# Patient Record
Sex: Male | Born: 1990 | Race: Black or African American | Hispanic: No | Marital: Single | Smoking: Never smoker
Health system: Southern US, Community
[De-identification: ages and names within clinical notes are randomized; demographics above are authoritative.]

## PROBLEM LIST (undated history)

## (undated) DIAGNOSIS — G253 Myoclonus: Secondary | ICD-10-CM

## (undated) DIAGNOSIS — K219 Gastro-esophageal reflux disease without esophagitis: Secondary | ICD-10-CM

## (undated) DIAGNOSIS — F419 Anxiety disorder, unspecified: Secondary | ICD-10-CM

## (undated) DIAGNOSIS — J45909 Unspecified asthma, uncomplicated: Secondary | ICD-10-CM

## (undated) DIAGNOSIS — I10 Essential (primary) hypertension: Secondary | ICD-10-CM

## (undated) DIAGNOSIS — F32A Depression, unspecified: Secondary | ICD-10-CM

## (undated) DIAGNOSIS — F329 Major depressive disorder, single episode, unspecified: Secondary | ICD-10-CM

## (undated) DIAGNOSIS — T7840XA Allergy, unspecified, initial encounter: Secondary | ICD-10-CM

## (undated) HISTORY — DX: Allergy, unspecified, initial encounter: T78.40XA

## (undated) HISTORY — DX: Myoclonus: G25.3

## (undated) HISTORY — DX: Essential (primary) hypertension: I10

## (undated) HISTORY — DX: Gastro-esophageal reflux disease without esophagitis: K21.9

## (undated) HISTORY — PX: OTHER SURGICAL HISTORY: SHX169

---

## 2011-03-01 ENCOUNTER — Emergency Department (HOSPITAL_COMMUNITY)
Admission: EM | Admit: 2011-03-01 | Discharge: 2011-03-01 | Disposition: A | Discharge status: Home / Self Care | Payer: Self-pay | Attending: Emergency Medicine | Admitting: Emergency Medicine

## 2011-03-01 DIAGNOSIS — L2989 Other pruritus: Secondary | ICD-10-CM | POA: Insufficient documentation

## 2011-03-01 DIAGNOSIS — B354 Tinea corporis: Secondary | ICD-10-CM | POA: Insufficient documentation

## 2011-03-01 DIAGNOSIS — L298 Other pruritus: Secondary | ICD-10-CM | POA: Insufficient documentation

## 2011-03-01 DIAGNOSIS — R21 Rash and other nonspecific skin eruption: Secondary | ICD-10-CM | POA: Insufficient documentation

## 2011-03-01 DIAGNOSIS — J45909 Unspecified asthma, uncomplicated: Secondary | ICD-10-CM | POA: Insufficient documentation

## 2011-07-12 ENCOUNTER — Encounter: Payer: Self-pay | Admitting: Nurse Practitioner

## 2011-07-12 ENCOUNTER — Emergency Department (HOSPITAL_COMMUNITY)
Admission: EM | Admit: 2011-07-12 | Discharge: 2011-07-12 | Disposition: A | Discharge status: Home / Self Care | Payer: Self-pay | Attending: Emergency Medicine | Admitting: Emergency Medicine

## 2011-07-12 DIAGNOSIS — L298 Other pruritus: Secondary | ICD-10-CM | POA: Insufficient documentation

## 2011-07-12 DIAGNOSIS — L2989 Other pruritus: Secondary | ICD-10-CM | POA: Insufficient documentation

## 2011-07-12 DIAGNOSIS — R21 Rash and other nonspecific skin eruption: Secondary | ICD-10-CM | POA: Insufficient documentation

## 2011-07-12 DIAGNOSIS — B354 Tinea corporis: Secondary | ICD-10-CM | POA: Insufficient documentation

## 2011-07-12 MED ORDER — KETOCONAZOLE 2 % EX CREA: TOPICAL_CREAM | Freq: Every day | CUTANEOUS | Status: AC

## 2011-07-12 NOTE — ED Notes (Signed)
Pt with itchy rash to neck/abd/groin/egs x 3 weeks. No one in household with same symptoms. No new products or meds.

## 2011-07-12 NOTE — ED Provider Notes (Signed)
History     CSN: [id number] Arrival date & time: 07/12/2011  1:39 PM   None     Chief Complaint  Patient presents with  . Rash    (Consider location/radiation/quality/duration/timing/severity/associated sxs/prior treatment) Patient is a 20 y.o. male presenting with rash. The history is provided by the patient. No language interpreter was used.  Rash  This is a recurrent problem. The current episode started more than 1 week ago. The problem has been gradually improving. The problem is associated with an unknown factor. There has been no fever. The rash is present on the torso, back, neck and left upper leg. The pain is at a severity of 0/10. The patient is experiencing no pain. The pain has been constant since onset. Associated symptoms include itching. Pertinent negatives include no blisters, no pain and no weeping. He has tried anti-itch cream for the symptoms. The treatment provided mild relief.   Patient is here with rash to R neck which is getting better by using anti-fungal cream.  Now the rash is spreading to his trunk and LE.  Mild itching, no fever.   History reviewed. No pertinent past medical history.  History reviewed. No pertinent past surgical history.  History reviewed. No pertinent family history.  History  Substance Use Topics  . Smoking status: Passive Smoker  . Smokeless tobacco: Not on file  . Alcohol Use: Yes     rare      Review of Systems  Skin: Positive for itching and rash.  All other systems reviewed and are negative.    Allergies  Review of patient's allergies indicates no known allergies.  Home Medications   Current Outpatient Rx  Name Route Sig Dispense Refill  . KETOCONAZOLE 2 % EX CREA Topical Apply topically daily. 15 g 3    BP 149/77  Pulse 87  Temp(Src) 98.2 F (36.8 C) (Oral)  Resp 16  Ht 5\' 10"  (1.778 m)  Wt 150 lb (68.04 kg)  BMI 21.52 kg/m2  SpO2 100%  Physical Exam  Nursing note and vitals reviewed. Constitutional:  He is oriented to person, place, and time. He appears well-developed and well-nourished. No distress.  HENT:  Head: Normocephalic.  Neck: Normal range of motion.  Cardiovascular: Normal rate.   Pulmonary/Chest: Effort normal.  Musculoskeletal: Normal range of motion.  Neurological: He is alert and oriented to person, place, and time.  Skin: Skin is warm and dry. Rash noted.  Psychiatric: He has a normal mood and affect.    ED Course  Procedures (including critical care time)  Labs Reviewed - No data to display No results found.   1. Tinea corporis       MDM  Treated for ringworm  Medical screening examination/treatment/procedure(s) were performed by non-physician practitioner and as supervising physician I was immediately available for consultation/collaboration. Osvaldo Human, M.D.       Jethro Bastos, NP 07/12/11 1437  Carleene Cooper III, MD 07/12/11 [phone]

## 2013-09-01 ENCOUNTER — Emergency Department (HOSPITAL_COMMUNITY)
Admission: EM | Admit: 2013-09-01 | Discharge: 2013-09-02 | Disposition: A | Discharge status: Home / Self Care | Payer: Self-pay | Attending: Emergency Medicine | Admitting: Emergency Medicine

## 2013-09-01 ENCOUNTER — Encounter (HOSPITAL_COMMUNITY): Payer: Self-pay | Admitting: Emergency Medicine

## 2013-09-01 DIAGNOSIS — L509 Urticaria, unspecified: Secondary | ICD-10-CM | POA: Insufficient documentation

## 2013-09-01 DIAGNOSIS — T398X2A Poisoning by other nonopioid analgesics and antipyretics, not elsewhere classified, intentional self-harm, initial encounter: Secondary | ICD-10-CM

## 2013-09-01 DIAGNOSIS — F329 Major depressive disorder, single episode, unspecified: Secondary | ICD-10-CM | POA: Insufficient documentation

## 2013-09-01 DIAGNOSIS — T450X4A Poisoning by antiallergic and antiemetic drugs, undetermined, initial encounter: Secondary | ICD-10-CM | POA: Insufficient documentation

## 2013-09-01 DIAGNOSIS — F3289 Other specified depressive episodes: Secondary | ICD-10-CM | POA: Insufficient documentation

## 2013-09-01 DIAGNOSIS — T394X2A Poisoning by antirheumatics, not elsewhere classified, intentional self-harm, initial encounter: Secondary | ICD-10-CM | POA: Insufficient documentation

## 2013-09-01 DIAGNOSIS — F32A Depression, unspecified: Secondary | ICD-10-CM

## 2013-09-01 DIAGNOSIS — T50992A Poisoning by other drugs, medicaments and biological substances, intentional self-harm, initial encounter: Secondary | ICD-10-CM | POA: Insufficient documentation

## 2013-09-01 DIAGNOSIS — J45909 Unspecified asthma, uncomplicated: Secondary | ICD-10-CM | POA: Insufficient documentation

## 2013-09-01 DIAGNOSIS — R000 Tachycardia, unspecified: Secondary | ICD-10-CM | POA: Insufficient documentation

## 2013-09-01 DIAGNOSIS — T391X1A Poisoning by 4-Aminophenol derivatives, accidental (unintentional), initial encounter: Secondary | ICD-10-CM | POA: Insufficient documentation

## 2013-09-01 DIAGNOSIS — T50902A Poisoning by unspecified drugs, medicaments and biological substances, intentional self-harm, initial encounter: Secondary | ICD-10-CM

## 2013-09-01 HISTORY — DX: Depression, unspecified: F32.A

## 2013-09-01 HISTORY — DX: Major depressive disorder, single episode, unspecified: F32.9

## 2013-09-01 HISTORY — DX: Anxiety disorder, unspecified: F41.9

## 2013-09-01 HISTORY — DX: Unspecified asthma, uncomplicated: J45.909

## 2013-09-01 LAB — CBC
HCT: 44.7 % (ref 39.0–52.0)
Hemoglobin: 14.9 g/dL (ref 13.0–17.0)
MCH: 29.8 pg (ref 26.0–34.0)
MCHC: 33.3 g/dL (ref 30.0–36.0)
MCV: 89.4 fL (ref 78.0–100.0)
Platelets: 239 10*3/uL (ref 150–400)
RBC: 5 MIL/uL (ref 4.22–5.81)
RDW: 12.1 % (ref 11.5–15.5)
WBC: 6.2 10*3/uL (ref 4.0–10.5)

## 2013-09-01 LAB — COMPREHENSIVE METABOLIC PANEL
ALT: 12 U/L (ref 0–53)
AST: 24 U/L (ref 0–37)
Albumin: 4.6 g/dL (ref 3.5–5.2)
Alkaline Phosphatase: 43 U/L (ref 39–117)
BUN: 13 mg/dL (ref 6–23)
CO2: 24 mEq/L (ref 19–32)
Calcium: 9.6 mg/dL (ref 8.4–10.5)
Chloride: 100 mEq/L (ref 96–112)
Creatinine, Ser: 1.21 mg/dL (ref 0.50–1.35)
GFR calc Af Amer: >90 mL/min (ref >90)
GFR calc non Af Amer: 83 mL/min — ABNORMAL LOW (ref >90)
Glucose, Bld: 152 mg/dL — ABNORMAL HIGH (ref 70–99)
Potassium: 3.8 mEq/L (ref 3.7–5.3)
Sodium: 138 mEq/L (ref 137–147)
Total Bilirubin: 0.4 mg/dL (ref 0.3–1.2)
Total Protein: 7.8 g/dL (ref 6.0–8.3)

## 2013-09-01 LAB — ACETAMINOPHEN LEVEL: Acetaminophen (Tylenol), Serum: <15 ug/mL (ref 10–30)

## 2013-09-01 LAB — RAPID URINE DRUG SCREEN, HOSP PERFORMED
Amphetamines: NOT DETECTED
Barbiturates: NOT DETECTED
Benzodiazepines: NOT DETECTED
Cocaine: NOT DETECTED
Opiates: NOT DETECTED
Tetrahydrocannabinol: POSITIVE — AB

## 2013-09-01 LAB — ETHANOL: Alcohol, Ethyl (B): <11 mg/dL (ref 0–11)

## 2013-09-01 LAB — SALICYLATE LEVEL: Salicylate Lvl: <2 mg/dL — ABNORMAL LOW (ref 2.8–20.0)

## 2013-09-01 NOTE — ED Notes (Addendum)
Poison control called. Recommend to get EKG check liver enzymes. Cheryl from poison control

## 2013-09-01 NOTE — ED Notes (Signed)
Pt reports SI for a few yaers, but last night took coricidin to try and kill himself. Denies physical pain. Reports problems with family and friends. Denies HI, AH/VH

## 2013-09-01 NOTE — ED Provider Notes (Signed)
CSN: [id number]     Arrival date & time 09/01/13  1821 History   First MD Initiated Contact with Patient 09/01/13 2110     Chief Complaint  Patient presents with  . Medical Clearance   (Consider location/radiation/quality/duration/timing/severity/associated sxs/prior Treatment) HPI  23 year old male with depression. Long-standing issue since early teenage years. No recent evaluations. Patient reports hives and lows. He feels like he is withdrawn. Dissected does not have many friends and is not L. doing the things like partying and going out he feels someone his age should be. He lives at home with his mother and he feels like she has to controlling. Currently not employed. The started back at school a few weeks ago. Occasional drug use. Last night it is felt overwhelmed and took approximately 20 tablets of Coricidin. He was trying to hurt himself. Denies any other ingestion. States he feels withdrawn. Uncomfortable in social situation. He feels like he has to drink to interact with people and typically does to excess and blacks out.    Past Medical History  Diagnosis Date  . Asthma    History reviewed. No pertinent past surgical history. History reviewed. No pertinent family history. History  Substance Use Topics  . Smoking status: Passive Smoke Exposure - Never Smoker  . Smokeless tobacco: Not on file  . Alcohol Use: Yes     Comment: rare    Review of Systems  All systems reviewed and negative, other than as noted in HPI.   Allergies  Review of patient's allergies indicates no known allergies.  Home Medications  No current outpatient prescriptions on file. BP 160/85  Pulse 111  Temp(Src) 98.3 F (36.8 C) (Oral)  Resp 16  SpO2 98% Physical Exam  Nursing note and vitals reviewed. Constitutional: He is oriented to person, place, and time. He appears well-developed and well-nourished. No distress.  HENT:  Head: Normocephalic and atraumatic.  Eyes: Conjunctivae are normal.  Right eye exhibits no discharge. Left eye exhibits no discharge.  Neck: Neck supple.  Cardiovascular: Regular rhythm and normal heart sounds.  Exam reveals no gallop and no friction rub.   No murmur heard. Mild tachycardia  Pulmonary/Chest: Effort normal and breath sounds normal. No respiratory distress.  Abdominal: Soft. He exhibits no distension. There is no tenderness.  Musculoskeletal: He exhibits no edema and no tenderness.  Neurological: He is alert and oriented to person, place, and time. No cranial nerve deficit. He exhibits normal muscle tone. Coordination normal.  Speech clear. Content appropriate. Occasionally some inappropriate laughter which seems from being uncomfortable. Does not appear to be responding to internal stimuli.   Skin: Skin is warm and dry.  Psychiatric: He has a normal mood and affect. His behavior is normal. Thought content normal.    ED Course  Procedures (including critical care time) Labs Review Labs Reviewed  COMPREHENSIVE METABOLIC PANEL - Abnormal; Notable for the following:    Glucose, Bld 152 (*)    GFR calc non Af Amer 83 (*)    All other components within normal limits  SALICYLATE LEVEL - Abnormal; Notable for the following:    Salicylate Lvl <2.0 (*)    All other components within normal limits  URINE RAPID DRUG SCREEN (HOSP PERFORMED) - Abnormal; Notable for the following:    Tetrahydrocannabinol POSITIVE (*)    All other components within normal limits  ACETAMINOPHEN LEVEL  CBC  ETHANOL   Imaging Review No results found.  EKG Interpretation   None  MDM   1. Intentional drug overdose   2. Depression     23yM with depression. SI with attempt last night. No HI nor psychosis. Possibly some element of social anxiety. Medically cleared at this point from ingestion. Tylenol level normal. Normal LFTs. Sinus tach noted on EKG. Consider 2/2 antihistamine in coricidin, but intervals ok. Psych eval.     Raeford RazorStephen Shriyans Kuenzi, MD 09/02/13  [phone]

## 2013-09-02 ENCOUNTER — Encounter (HOSPITAL_COMMUNITY): Payer: Self-pay | Admitting: *Deleted

## 2013-09-02 ENCOUNTER — Inpatient Hospital Stay (HOSPITAL_COMMUNITY)
Admission: EM | Admit: 2013-09-02 | Discharge: 2013-09-08 | DRG: 885 | Disposition: A | Discharge status: Home / Self Care | Payer: No Typology Code available for payment source | Source: Intra-hospital | Attending: Psychiatry | Admitting: Psychiatry

## 2013-09-02 DIAGNOSIS — F332 Major depressive disorder, recurrent severe without psychotic features: Principal | ICD-10-CM | POA: Diagnosis present

## 2013-09-02 DIAGNOSIS — Z598 Other problems related to housing and economic circumstances: Secondary | ICD-10-CM

## 2013-09-02 DIAGNOSIS — F1010 Alcohol abuse, uncomplicated: Secondary | ICD-10-CM | POA: Diagnosis present

## 2013-09-02 DIAGNOSIS — Z5987 Material hardship due to limited financial resources, not elsewhere classified: Secondary | ICD-10-CM

## 2013-09-02 DIAGNOSIS — M4120 Other idiopathic scoliosis, site unspecified: Secondary | ICD-10-CM | POA: Diagnosis present

## 2013-09-02 DIAGNOSIS — F1994 Other psychoactive substance use, unspecified with psychoactive substance-induced mood disorder: Secondary | ICD-10-CM | POA: Diagnosis present

## 2013-09-02 DIAGNOSIS — R45851 Suicidal ideations: Secondary | ICD-10-CM

## 2013-09-02 DIAGNOSIS — F411 Generalized anxiety disorder: Secondary | ICD-10-CM | POA: Diagnosis present

## 2013-09-02 DIAGNOSIS — T6592XA Toxic effect of unspecified substance, intentional self-harm, initial encounter: Secondary | ICD-10-CM | POA: Diagnosis present

## 2013-09-02 DIAGNOSIS — F329 Major depressive disorder, single episode, unspecified: Secondary | ICD-10-CM | POA: Diagnosis present

## 2013-09-02 DIAGNOSIS — F1210 Cannabis abuse, uncomplicated: Secondary | ICD-10-CM | POA: Diagnosis present

## 2013-09-02 DIAGNOSIS — G4700 Insomnia, unspecified: Secondary | ICD-10-CM | POA: Diagnosis present

## 2013-09-02 DIAGNOSIS — J45909 Unspecified asthma, uncomplicated: Secondary | ICD-10-CM | POA: Diagnosis present

## 2013-09-02 DIAGNOSIS — T6591XA Toxic effect of unspecified substance, accidental (unintentional), initial encounter: Secondary | ICD-10-CM

## 2013-09-02 MED ORDER — MAGNESIUM HYDROXIDE 400 MG/5ML PO SUSP
30.0000 mL | Freq: Every day | ORAL | Status: DC | PRN
Start: 2013-09-02 — End: 2013-09-08

## 2013-09-02 MED ORDER — TRAZODONE HCL 50 MG PO TABS
50.0000 mg | ORAL_TABLET | Freq: Every evening | ORAL | Status: DC | PRN
  Administered 2013-09-02 – 2013-09-03 (×2): 50 mg via ORAL
  Filled 2013-09-02 (×8): qty 1

## 2013-09-02 MED ORDER — BUPROPION HCL ER (SR) 100 MG PO TB12
100.0000 mg | ORAL_TABLET | Freq: Every day | ORAL | Status: DC
  Administered 2013-09-02 – 2013-09-07 (×6): 100 mg via ORAL
  Filled 2013-09-02 (×10): qty 1

## 2013-09-02 MED ORDER — ALUM & MAG HYDROXIDE-SIMETH 200-200-20 MG/5ML PO SUSP: 30.0000 mL | ORAL | Status: DC | PRN

## 2013-09-02 MED ORDER — ACETAMINOPHEN 325 MG PO TABS: 650.0000 mg | ORAL_TABLET | Freq: Four times a day (QID) | ORAL | Status: DC | PRN

## 2013-09-02 NOTE — Tx Team (Signed)
Interdisciplinary Treatment Plan Update   Date Reviewed:  09/02/2013  Time Reviewed:  8:25 AM  Progress in Treatment:   Attending groups: Yes Participating in groups: Yes Taking medication as prescribed: Yes  Tolerating medication: Yes Family/Significant other contact made: No, but will ask patient for consent for collateral contact Patient understands diagnosis: Yes  Discussing patient identified problems/goals with staff: Yes Medical problems stabilized or resolved: Yes Denies suicidal/homicidal ideation: Yes Patient has not harmed self or others: Yes  For review of initial/current patient goals, please see plan of care.  Estimated Length of Stay:  4-5 days  Reasons for Continued Hospitalization:  Anxiety Depression Medication stabilization Suicidal ideation  New Problems/Goals identified:    Discharge Plan or Barriers:   Home with outpatient follow up to be determined  Additional Comments:  Eugene Sanders is a 23 y.o. male [facility] SI/Depression/Social Anxiety. Pt denies HI/AVH. Pt reports the following: Pt felt overwhelmed with life, states that a male friend that he liked did not want to take the friendship any further and he was hurt by her rejection and ingested 16-20 Coricidin tabs. He admits he wanted to harm himself. This is pt.'s 1st SI attempt and says he has SI thoughts all the time. Pt says he long hx of depression since his teens and has no hx of outpatient services. Pt tells this Clinical research associatewriter that he has social anxiety and trouble talking to people and doesn't like being alone--"I have to learn how to be by myself". Pt says he lives with his mother and brother and says they are not close and are not supportive. Pt tells this Clinical research associatewriter that he uses alcohol and marijuana--drinks 1-2 40's weekly and 1 blunt weekly.    Attendees:  Patient:  09/02/2013 8:25 AM   Signature: Mervyn GayJ. Jonnalagadda, MD 09/02/2013 8:25 AM  Signature:  09/02/2013 8:25 AM  Signature:  Claudette Headonrad  Withrow, NP 09/02/2013 8:25 AM  Signature:Beverly Terrilee CroakKnight, RN 09/02/2013 8:25 AM  Signature:  09/02/2013 8:25 AM  Signature:  Juline PatchQuylle Quashaun Lazalde, LCSW 09/02/2013 8:25 AM  Signature:  Eugene Ivanhelsea Horton, LCSW 09/02/2013 8:25 AM  Signature:  Leisa LenzValerie Enoch, Care Coordinator 09/02/2013 8:25 AM  Signature:  Aloha GellKrista Dopson, RN 09/02/2013 8:25 AM  Signature:  09/02/2013  8:25 AM  Signature:   Onnie BoerJennifer Clark, RN [facility] 09/02/2013  8:25 AM  Signature:  Harold Barbanonecia Byrd, RN 09/02/2013  8:25 AM    Scribe for Treatment Team:   Juline PatchQuylle Danelly Hassinger,  09/02/2013 8:25 AM

## 2013-09-02 NOTE — BH Assessment (Signed)
Assessment Note  Eugene Sanders is a 23 y.o. male who presents to [facility] with SI/Depression/Social Anxiety.  Pt denies HI/AVH. Pt reports the following:  Pt felt overwhelmed with life, states that a male friend that he liked did not want to take the friendship any further and he was hurt by her rejection and ingested 16-20 Coricidin tabs. He admits he wanted to harm himself. This is pt.'s 1st SI attempt and says he has SI thoughts all the time.  Pt says he long hx of depression since his teens and has no hx of outpatient services.  Pt tells this Clinical research associate that he has social anxiety and trouble talking to people and doesn't like being alone--"I have to learn how to be by myself".   Pt says he lives with his mother and brother and says they are not close and are not supportive.  Pt tells this Clinical research associate that he uses alcohol and marijuana--drinks 1-2 40's weekly and 1 blunt weekly.         Axis I: Alcohol Abuse, Depressive Disorder NOS and Cannabis Abuse  Axis II: Deferred Axis III:  Past Medical History  Diagnosis Date  . Asthma   . Depression   . Anxiety    Axis IV: other psychosocial or environmental problems, problems related to social environment and problems with primary support group Axis V: 31-40 impairment in reality testing  Past Medical History:  Past Medical History  Diagnosis Date  . Asthma   . Depression   . Anxiety     History reviewed. No pertinent past surgical history.  Family History: History reviewed. No pertinent family history.  Social History:  reports that he has been passively smoking.  He does not have any smokeless tobacco history on file. He reports that he drinks alcohol. He reports that he uses illicit drugs (Marijuana).  Additional Social History:  Alcohol / Drug Use Pain Medications: None  Prescriptions: None  Over the Counter: None  History of alcohol / drug use?: Yes Substance #1 Name of Substance 1: Alcohol  1 - Age of First Use: Teens  1 - Amount  (size/oz): 1-2 40's  1 - Frequency: Weekly  1 - Duration: On-going  1 - Last Use / Amount: Unk  Substance #2 Name of Substance 2: THC  2 - Age of First Use: Teens  2 - Amount (size/oz): 1 Blunt  2 - Frequency: Occasional  2 - Duration: On-going  2 - Last Use / Amount: Unk   CIWA: CIWA-Ar BP: 131/87 mmHg Pulse Rate: 77 COWS:    Allergies: No Known Allergies  Home Medications:  (Not in a hospital admission)  OB/GYN Status:  No LMP for male patient.  General Assessment Data Location of Assessment: [facility] ED Is this a Tele or Face-to-Face Assessment?: Tele Assessment Is this an Initial Assessment or a Re-assessment for this encounter?: Initial Assessment Living Arrangements: Parent;Other relatives (Lives with mother and brother ) Can pt return to current living arrangement?: Yes Admission Status: Voluntary Is patient capable of signing voluntary admission?: Yes Transfer from: Acute Hospital Referral Source: MD  Medical Screening Exam [facility] ONLY) Medical Exam completed: No Reason for MSE not completed: Other: (None )  [facility] Crisis Care Plan Living Arrangements: Parent;Other relatives (Lives with mother and brother ) Name of Psychiatrist: None  Name of Therapist: None   Education Status Is patient currently in school?: No Current Grade: None  Highest grade of school patient has completed: None  Name of school: None  Contact  person: None   Risk to self Suicidal Ideation: Yes-Currently Present Suicidal Intent: Yes-Currently Present Is patient at risk for suicide?: Yes Suicidal Plan?: Yes-Currently Present Specify Current Suicidal Plan: Overdosed on 16-20 Coricidin  Access to Means: Yes Specify Access to Suicidal Means: Pills What has been your use of drugs/alcohol within the last 12 months?: Abusing: alcohol, thc  Previous Attempts/Gestures: No How many times?: 0 Other Self Harm Risks: None  Triggers for Past Attempts: None known Intentional Self Injurious  Behavior: None Family Suicide History: No Recent stressful life event(s): Other (Comment) (SI/depression ) Persecutory voices/beliefs?: No Depression: Yes Depression Symptoms: Loss of interest in usual pleasures;Feeling worthless/self pity;Isolating;Insomnia Substance abuse history and/or treatment for substance abuse?: Yes Suicide prevention information given to non-admitted patients: Not applicable  Risk to Others Homicidal Ideation: No Thoughts of Harm to Others: No Current Homicidal Intent: No Current Homicidal Plan: No Access to Homicidal Means: No Identified Victim: None  History of harm to others?: No Assessment of Violence: None Noted Violent Behavior Description: None  Does patient have access to weapons?: No Criminal Charges Pending?: No Does patient have a court date: No  Psychosis Hallucinations: None noted Delusions: None noted  Mental Status Report Appear/Hygiene: Other (Comment) (Appropriate ) Eye Contact: Fair Motor Activity: Unremarkable Speech: Logical/coherent;Soft Level of Consciousness: Alert Mood: Depressed;Sad Affect: Depressed;Sad Anxiety Level: None Thought Processes: Coherent;Relevant Judgement: Impaired Orientation: Person;Place;Time;Situation Obsessive Compulsive Thoughts/Behaviors: None  Cognitive Functioning Concentration: Normal Memory: Recent Intact;Remote Intact IQ: Average Insight: Poor Impulse Control: Poor Appetite: Fair Weight Loss: 0 Weight Gain: 0 Sleep: Decreased Total Hours of Sleep: 2 Vegetative Symptoms: None  ADLScreening [facility] Assessment Services) Patient's cognitive ability adequate to safely complete daily activities?: Yes Patient able to express need for assistance with ADLs?: Yes Independently performs ADLs?: Yes (appropriate for developmental age)  Prior Inpatient Therapy Prior Inpatient Therapy: No Prior Therapy Dates: None  Prior Therapy Facilty/Provider(s): None  Reason for Treatment: None   Prior  Outpatient Therapy Prior Outpatient Therapy: No Prior Therapy Dates: None  Prior Therapy Facilty/Provider(s): None  Reason for Treatment: None   ADL Screening (condition at time of admission) Patient's cognitive ability adequate to safely complete daily activities?: Yes Is the patient deaf or have difficulty hearing?: No Does the patient have difficulty seeing, even when wearing glasses/contacts?: No Does the patient have difficulty concentrating, remembering, or making decisions?: No Patient able to express need for assistance with ADLs?: Yes Does the patient have difficulty dressing or bathing?: No Independently performs ADLs?: Yes (appropriate for developmental age) Does the patient have difficulty walking or climbing stairs?: No Weakness of Legs: None Weakness of Arms/Hands: None  Home Assistive Devices/Equipment Home Assistive Devices/Equipment: None  Therapy Consults (therapy consults require a physician order) PT Evaluation Needed: No OT Evalulation Needed: No SLP Evaluation Needed: No Abuse/Neglect Assessment (Assessment to be complete while patient is alone) Physical Abuse: Denies Verbal Abuse: Denies Sexual Abuse: Denies Exploitation of patient/patient's resources: Denies Self-Neglect: Denies Values / Beliefs Cultural Requests During Hospitalization: None Spiritual Requests During Hospitalization: None Consults Spiritual Care Consult Needed: No Social Work Consult Needed: No Merchant navy officer (For Healthcare) Advance Directive: Patient does not have advance directive;Patient would not like information Pre-existing out of facility DNR order (yellow form or pink MOST form): No Nutrition Screen- [facility] Patient's home diet: Regular  Additional Information 1:1 In Past 12 Months?: No CIRT Risk: No Elopement Risk: No Does patient have medical clearance?: Yes     Disposition:  Disposition Initial Assessment Completed for this Encounter:  Yes Disposition  of Patient: Inpatient treatment program;Referred to (Acceopted by Donell SievertSpencer Simon, [place]; [phone]) Type of inpatient treatment program: Adult Patient referred to: Other (Comment) (Accepted by Donell SievertSpencer Simon, PA; [phone])  On Site Evaluation by:   Reviewed with Physician:    Murrell ReddenSimmons, Fleurette Woolbright C 09/02/2013 4:08 AM

## 2013-09-02 NOTE — [facility] Group Notes (Signed)
[facility] LCSW Group Therapy  Emotional Regulation 1:15 - 2: 30 PM        09/02/2013  3:34 PM   Type of Therapy:  Group Therapy  Participation Level:  Appropriate  Participation Quality:  Appropriate  Affect:  Appropriate  Cognitive:  Attentive Appropriate  Insight:  Developing/Improving Engaged  Engagement in Therapy:  Developing/Improving Engaged  Modes of Intervention:  Discussion Exploration Problem-Solving Supportive  Summary of Progress/Problems:  Group topic was emotional regulations.  Patient participated in the discussion and was able to identify an emotion that needed to regulated.  Patient shared he has a lot of anger over past event and will sometimes act out in aggression.  Patient was able to identify approprite coping skills and the need to walk away from trouble.  Wynn BankerHodnett, Tricha Ruggirello Hairston 09/02/2013 3:34 PM

## 2013-09-02 NOTE — Progress Notes (Signed)
This is a 23 years old African American male admitted this morning for Depressive d/O NOS and Alcohol Abuse. He reported feeling overwhelmed with life and he took a pack of cold medication wanting to end his life. Endorsed having mood swings; "one minute I'm happy the next minute I'm sad, I isolate my self from people and drink". He also stated that he has relationship problem with girl friend and; " I can't continue without her". He said he lives with his mother and brother but they don't really get along. Patient endorsed daily alcohol and THC use; has been drinking daily since he was 23 years old up to 2(40 oz) daily and THC weekly since he was 23 years old. 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 He denied SI/HI and denied hallucinations during the assessment. Skin assessment within normal limit, thought process organized, mood and affect sad and depressed. Patient oriented to the unit and Q 15 minute check initiated.

## 2013-09-02 NOTE — Progress Notes (Signed)
D Pt. Denies SI and HI.  No complaints of pain or discomfort noted.   A Writer offered support and encouragement.  Discussed coping skills with pt.  R Pt remains safe on the unit.  Pt. Makes poor eye contact and forwards little.  Pt. Was in bed when write assessed him.  He received his first dose of wellbutrin this pm.
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
## 2013-09-02 NOTE — [facility] Group Notes (Signed)
[facility]   09/02/2013 9:46 AM    Participation Quality:  Appropraite  Mood/Affect:  Appropriate  Depression Rating:  8  Anxiety Rating:  2  Thoughts of Suicide:  No  Will you contract for safety?   NA  Current AVH:  No  Plan for Discharge/Comments:  Patient attended discharge planning group and actively participated in group.  He reports admitting following a suicide attempt.  He advised of not having outpatient services.  He endorsed daily ETOH use.  CSW provided all participants with daily workbook.   Transportation Means: Patient has transportation.   Supports:  Patient has a support system.   Kiven Vangilder, Joesph JulyQuylle Hairston

## 2013-09-02 NOTE — Tx Team (Signed)
Initial Interdisciplinary Treatment Plan  PATIENT STRENGTHS: (choose at least two) Average or above average intelligence Motivation for treatment/growth Supportive family/friends  PATIENT STRESSORS: Financial difficulties Marital or family conflict Occupational concerns Substance abuse   PROBLEM LIST: Problem List/Patient Goals Date to be addressed Date deferred Reason deferred Estimated date of resolution  Depression 09/02/13                                                      DISCHARGE CRITERIA:  Ability to meet basic life and health needs Improved stabilization in mood, thinking, and/or behavior Motivation to continue treatment in a less acute level of care Verbal commitment to aftercare and medication compliance Withdrawal symptoms are absent or subacute and managed without 24-hour nursing intervention  PRELIMINARY DISCHARGE PLAN: Attend PHP/IOP Outpatient therapy Participate in family therapy Return to previous living arrangement Return to previous work or school arrangements  PATIENT/FAMIILY INVOLVEMENT: This treatment plan has been presented to and reviewed with the patient, Eugene Sanders, and/or family member,.  The patient and family have been given the opportunity to ask questions and make suggestions.  Eugene Sanders, Eugene Sanders [facility] 09/02/2013, 5:05 AM

## 2013-09-02 NOTE — Progress Notes (Signed)
Adult Psychoeducational Group Note  Date:  09/02/2013 Time:  2:23 PM  Group Topic/Focus:  Personal Choices and Values:   The focus of this group is to help patients assess and explore the importance of values in their lives, how their values affect their decisions, how they express their values and what opposes their expression.  Participation Level:  Did Not Attend   Tonita CongMcLaurin, Farzad Tibbetts L 09/02/2013, 2:23 PM

## 2013-09-02 NOTE — Progress Notes (Addendum)
D:  Patient's self inventory sheet, patient has poor sleep, improving appetite, normal energy level, improving attention span.  Rated depression 8, hopeless 5.   SI off/on, contracts for safety.  Denied physical problems.  "Be more social and tell people if I have a problem.  Try to find my purpose in life again."  Plans to live with mother after discharge.  Needs financial assistance to purchase medications. A:  Medications administered per MD orders.  Emotional support and encouragement given patient. R:  Denied HI.  Denied A/V hallucinations.  Denied pain.  Stated he is off/on SI, contracts for safety.  Will continue to monitor patient for safety with 15 minute checks.  Safety maintained.

## 2013-09-02 NOTE — Progress Notes (Signed)
Pt did not attend group due to him sleeping.
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
## 2013-09-02 NOTE — ED Notes (Signed)
No acute distress noted.

## 2013-09-03 DIAGNOSIS — F411 Generalized anxiety disorder: Secondary | ICD-10-CM

## 2013-09-03 DIAGNOSIS — F1010 Alcohol abuse, uncomplicated: Secondary | ICD-10-CM

## 2013-09-03 MED ORDER — HYDROXYZINE HCL 25 MG PO TABS
25.0000 mg | ORAL_TABLET | Freq: Four times a day (QID) | ORAL | Status: DC | PRN
  Administered 2013-09-03: 25 mg via ORAL
  Filled 2013-09-03: qty 1

## 2013-09-03 NOTE — BHH Group Notes (Signed)
B[facility] LCSW Group Therapy  [facility] [place] 1:15 - 2:30 PM  09/03/2013   Type of Therapy:  Group Therapy  Participation Level: Minimal  Participation Quality:  Attentive  Affect:  Appropriate  Cognitive:  Appropriate  Insight:  Developing/Improving   Engagement in Therapy:  Developing/Improving   Modes of Intervention:  Discussion, Education, Exploration, Problem-Solving, Rapport Building, Support   Summary of Progress/Problems:  Patient listened attentively to speaker from [facility] but made no comments on the presentation.   Wynn BankerHodnett, Dailee Manalang Hairston 09/03/2013

## 2013-09-03 NOTE — Progress Notes (Signed)
Recreation Therapy Notes  Date: 01.29.2015 Time: 2:45pm Location: [address] Dayroom   Group Topic: Leisure Education  Goal Area(s) Addresses:  Patient will identify positive leisure activities.  Patient will identify one positive benefit of participation in leisure activities.   Behavioral Response:  Appropriate   Intervention: Game  Activity: Leisure ABC's. Patients were asked to identify a leisure activity to correspond with each letter of the alphabet. Group discussion focused on the use of leisure as a coping mechanism, as well as a way to build support system post d/c.   Education:  Leisure Education, Building control surveyorDischarge Planning, Coping Skills   Education Outcome: Acknowledges understanding  Clinical Observations/Feedback: Patient attended group, but did not contribute to group list or discussion.    Marykay Lexenise L Maribella Kuna, LRT/CTRS  Ester Hilley L 09/03/2013 5:11 PM

## 2013-09-03 NOTE — Progress Notes (Signed)
Adult Psychoeducational Group Note  Date:  09/03/2013 Time:  1:42 PM  Group Topic/Focus:  Stages of Change:   The focus of this group is to explain the stages of change and help patients identify changes they want to make upon discharge.  Participation Level:  Active  Participation Quality:  Appropriate  Affect:  Appropriate  Cognitive:  Appropriate  Insight: Appropriate  Engagement in Group:  Engaged  Modes of Intervention:  Discussion and Education  Additional Comments:  Pt. Participated in group. Pt expressed that he enjoys watching TV and chilling when dealing with life stressors.  Tonita CongMcLaurin, Garnie Borchardt L 09/03/2013, 1:42 PM

## 2013-09-03 NOTE — Progress Notes (Signed)
D: Pt denies SI/HI/AVH. Pt is pleasant and cooperative. Pt says he wants to focus on school ([employer]). Pt wants to be a Emergency planning/management officerpolice officer. "everything going good, I'm good"  A: Pt was offered support and encouragement. Pt was given scheduled medications. Pt was encourage to attend groups. Q 15 minute checks were done for safety.   R:Pt attends groups and interacts well with peers and staff. Pt is taking medication. Pt has no complaints at this time.Pt receptive to treatment and safety maintained on unit.

## 2013-09-03 NOTE — Progress Notes (Signed)
The focus of this group is to educate the patient on the purpose and policies of crisis stabilization and provide a format to answer questions about their admission.  The group details unit policies and expectations of patients while admitted.  Patient did not attend 0900 nurse education orientation group this morning.  Patient stayed in bed.   

## 2013-09-03 NOTE — Progress Notes (Signed)
  D: Pt observed sleeping in bed with eyes closed. RR even and unlabored. No distress noted  .  A: Q 15 minute checks were done for safety.  R: safety maintained on unit.  

## 2013-09-03 NOTE — Progress Notes (Signed)
Adult Psychoeducational Group Note  Date:  09/03/2013 Time:  2000  Group Topic/Focus:  Wrap-Up Group:   The focus of this group is to help patients review their daily goal of treatment and discuss progress on daily workbooks.  Participation Level:  Minimal  Participation Quality:  Appropriate  Affect:  Appropriate  Cognitive:  Appropriate  Insight: Good  Engagement in Group:  Limited  Modes of Intervention:  Discussion and Support  Additional Comments:  Pt shared that his therapeutic leisure activities included watching sports, reading and watching Netflix.   Humberto SealsWhitaker, Makaila Windle Monique 09/03/2013, 10:23 PM

## 2013-09-03 NOTE — [facility] Suicide Risk Assessment (Signed)
[facility] INPATIENT:  Family/Significant Other Suicide Prevention Education  Suicide Prevention Education:  Education Completed; Eugene Sanders, Friend, Mentor/Former Case Manager, [phone]; has been identified by the patient as the family member/significant other with whom the patient will be residing, and identified as the person(s) who will aid the patient in the event of a mental health crisis (suicidal ideations/suicide attempt).  With written consent from the patient, the family member/significant other has been provided the following suicide prevention education, prior to the and/or following the discharge of the patient.  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  The suicide prevention education provided includes the following:  Suicide risk factors  Suicide prevention and interventions  National Suicide Hotline telephone number  [facility] assessment telephone number  [facility] Emergency Assistance 911  [facility] and/or Residential Mobile Crisis Unit telephone number  Request made of family/significant other to:  Remove weapons (e.g., guns, rifles, knives), all items previously/currently identified as safety concern.  Friend advised he does not know of patient having access to guns.  Remove drugs/medications (over-the-counter, prescriptions, illicit drugs), all items previously/currently identified as a safety concern.  The family member/significant other verbalizes understanding of the suicide prevention education information provided.  The family member/significant other agrees to remove the items of safety concern listed above.  Eugene Sanders, Eugene Sanders 09/03/2013, 4:00 PM

## 2013-09-03 NOTE — Progress Notes (Signed)
D:  Patient's self inventory sheet, patient has fair sleep, improving appetite, normal energy level, improving attention span.  Rated depression #7, hopeless and anxiety #5.  Denied withdrawals.  Denied SI.  Denied physical problems.  Worst pain #1.  Plans to "talk more, be more active" after discharge.  No discharge plans.  No problems taking medications after discharge. A:  Medications administered per MD orders.  Emotional support and encouragement given patient. R:  Denied SI and HI.  Denied A/V hallucinations.  Denied pain this morning.  Will continue to monitor patient for safety with 15 minute checks.  Safety maintained.

## 2013-09-04 DIAGNOSIS — F322 Major depressive disorder, single episode, severe without psychotic features: Secondary | ICD-10-CM

## 2013-09-04 MED ORDER — TRAZODONE HCL 100 MG PO TABS
100.0000 mg | ORAL_TABLET | Freq: Every evening | ORAL | Status: DC | PRN
  Administered 2013-09-04 – 2013-09-07 (×4): 100 mg via ORAL
  Filled 2013-09-04 (×13): qty 1

## 2013-09-04 NOTE — Progress Notes (Signed)
D:Pt reports that he had a hard time falling asleep last night and was sleepy this morning. Pt is mildly anxious, cooperative and attending groups.  A:Offered support, encouragement and 15 minute checks. R:Pt denies si and hi. Pt denies hallucinations. Safety maintained on the unit.

## 2013-09-04 NOTE — Progress Notes (Signed)
Recreation Therapy Notes  Date: 01.30.2015 Time: 2:45pm Location: [address] Dayroom   Group Topic: Communication, Team Building, Problem Solving  Goal Area(s) Addresses:  Patient will effectively work with peer towards shared goal.  Patient will identify skill used to make activity successful.  Patient will identify how skills used during activity can be used to reach post d/c goals.   Behavioral Response: Engaged, Attentive, Appropriate   Intervention: Problem Solving Activitiy  Activity: Life Boat. Patients were given a scenario about being on a sinking yacht. Patients were informed the yacht included 15 guest, 8 of which could be placed on the life boat, along with all group members. Individuals on guest list were of varying socioeconomic classes such as a Education officer, museumriest, Materials engineerresident Obama, MidwifeBus Driver, Tree surgeonTeacher and Chef.   Education: Pharmacist, communityocial Skills, Discharge Planning   Education Outcome: Acknowledges understanding  Clinical Observations/Feedback: Patient actively engaged in group activity, voicing his opinion and debating with peers appropriately. Patient made no contributions to group discussion, but appeared to actively listen as he maintained appropriate eye contact with speaker.   Marykay Lexenise L Murphy Bundick, LRT/CTRS  Jearl KlinefelterBlanchfield, Lukah Goswami L 09/04/2013 5:16 PM

## 2013-09-04 NOTE — Progress Notes (Signed)
D: Pt denies SI/HI/AVH. Pt is pleasant and cooperative. Pt plans to move with his father in [place] around March. Pt plans to continue school and try to find a job.   A: Pt was offered support and encouragement. Pt was given scheduled medications. Pt was encourage to attend groups. Q 15 minute checks were done for safety.   R:Pt attends groups and interacts well with peers and staff. Pt is taking medication. Pt has no complaints at this time.Pt receptive to treatment and safety maintained on unit.

## 2013-09-04 NOTE — Tx Team (Signed)
Interdisciplinary Treatment Plan Update   Date Reviewed:  09/04/2013  Time Reviewed:  8:42 AM  Progress in Treatment:   Attending groups: Yes Participating in groups: Yes Taking medication as prescribed: Yes  Tolerating medication: Yes Family/Significant other contact made:Yes, collateral contact with friend Patient understands diagnosis: Yes  Discussing patient identified problems/goals with staff: Yes Medical problems stabilized or resolved: Yes Denies suicidal/homicidal ideation: Yes Patient has not harmed self or others: Yes  For review of initial/current patient goals, please see plan of care.  Estimated Length of Stay:  3-4 days  Reasons for Continued Hospitalization:  Anxiety Depression Medication stabilization   New Problems/Goals identified:    Discharge Plan or Barriers:   Home [facility]  Additional Comments:     Attendees:  Patient:  09/04/2013 8:42 AM   Signature: Mervyn GayJ. Jonnalagadda, MD 09/04/2013 8:42 AM  Signature:  Silverio DecampJamison Lloyd, NP 09/04/2013 8:42 AM  Signature:  Waynetta SandyJan Wright, RN  09/04/2013 8:42 AM  Signature: Nestor Ramproy Duncan, RN 09/04/2013 8:42 AM  Signature:  09/04/2013 8:42 AM  Signature:  Juline PatchQuylle Charika Mikelson, LCSW 09/04/2013 8:42 AM  Signature:  Reyes Ivanhelsea Horton, LCSW 09/04/2013 8:42 AM  Signature:  Leisa LenzValerie Enoch, Care Coordinator 09/04/2013 8:42 AM  Signature:   09/04/2013 8:42 AM  Signature:  09/04/2013  8:42 AM  Signature:   Onnie BoerJennifer Clark, RN [facility] 09/04/2013  8:42 AM  Signature:  Harold Barbanonecia Byrd, RN 09/04/2013  8:42 AM    Scribe for Treatment Team:   Juline PatchQuylle Shian Goodnow,  09/04/2013 8:42 AM

## 2013-09-04 NOTE — [facility] Group Notes (Signed)
[facility] LCSW Group Therapy  Feelings Around Relapse 1:15 -2:30        09/04/2013   Type of Therapy:  Group Therapy  Participation Level:  Appropriate  Participation Quality:  Appropriate  Affect:  Appropriate  Cognitive:  Attentive Appropriate  Insight:  Developing/Improving  Engagement in Therapy: Developing/Improving  Modes of Intervention:  Discussion Exploration Problem-Solving Supportive  Summary of Progress/Problems:  The topic for today was feelings around relapse.  Patient processed feelings toward relapse and was able to relate to peers.  Patient shared relapsing for him would be drinking alcohol.  He shared he does not know his limits and usually passes out from drinking.  Patient identified coping skills that can be used to prevent a relapse.   Wynn BankerHodnett, Eugene Sanders 09/04/2013

## 2013-09-04 NOTE — [facility] Group Notes (Signed)
[facility]   09/04/2013 9:50 AM    Participation Quality:  Appropraite  Mood/Affect:  Appropriate  Depression Rating:  5/6  Anxiety Rating:  5/6  Thoughts of Suicide:  No  Will you contract for safety?   NA  Current AVH:  No  Plan for Discharge/Comments:  Patient attended discharge planning group and actively participated in group.  He reports being a little better.  [facility]  CSW provided all participants with daily workbook.   Transportation Means: Patient has transportation.   Supports:  Patient has a support system.   Eugene Sanders, Eugene Sanders

## 2013-09-04 NOTE — Progress Notes (Signed)
Adult Psychoeducational Group Note  Date:  09/04/2013 Time:  9:31 PM  Group Topic/Focus:  Wrap-Up Group:   The focus of this group is to help patients review their daily goal of treatment and discuss progress on daily workbooks.  Participation Level:  Active  Participation Quality:  Appropriate  Affect:  Appropriate  Cognitive:  Appropriate  Insight: Appropriate  Engagement in Group:  Engaged  Modes of Intervention:  Discussion  Additional Comments: The patient expressed that he learn in group not to isolate himself from people.  Octavio Mannshigpen, Wah Sabic Lee 09/04/2013, 9:31 PM

## 2013-09-04 NOTE — Progress Notes (Signed)
[facility]  09/04/2013 5:37 PM Eugene RogueXavier Sanders  MRN:  [id number] Subjective:  Patient has issues sleeping--Trazodone increased.  Appetite is "Ok", depression remains at a 5/10 with suicidal ideations at times.   He is currently living with his mother and going to school for criminal justice.  He downplays his drinking issues. 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 Asthma   . Depression   . Anxiety    Axis IV: other psychosocial or environmental problems, problems related to social environment and problems with primary support group Axis V: 41-50 serious symptoms  ADL's:  Intact  Sleep: Poor  Appetite:  Fair  Suicidal Ideation:  Plan:  vague Intent:  none Means:  none Homicidal Ideation:  Denies  Psychiatric Specialty Exam: Physical Exam  Constitutional: He is oriented to person, place, and time. He appears well-developed and well-nourished.  HENT:  Head: Normocephalic and atraumatic.  Neck: Normal range of motion.  Musculoskeletal: Normal range of motion.  Neurological: He is alert and oriented to person, place, and time.  Skin: Skin is warm and dry.    Review of Systems  Constitutional: Negative.   HENT: Negative.   Eyes: Negative.   Respiratory: Negative.   Cardiovascular: Negative.   Gastrointestinal: Negative.   Genitourinary: Negative.   Musculoskeletal: Negative.   Skin: Negative.   Neurological: Negative.   Endo/Heme/Allergies: Negative.   Psychiatric/Behavioral: Positive for depression and substance abuse.    Blood pressure 115/81, pulse 77, temperature 97.9 F  (36.6 C), temperature source Oral, resp. rate 16, height 5' 8.25" (1.734 m), weight 72.122 kg (159 lb), SpO2 98.00%.Body mass index is 23.99 kg/(m^2).  General Appearance: Casual  Eye Contact::  Fair  Speech:  Normal Rate  Volume:  Decreased  Mood:  Anxious and Depressed  Affect:  Congruent  Thought Process:  Coherent  Orientation:  Full (Time, Place, and Person)  Thought Content:  WDL  Suicidal Thoughts:  Yes.  without intent/plan  Homicidal Thoughts:  No  Memory:  Immediate;   Fair Recent;   Fair Remote;   Fair  Judgement:  Poor  Insight:  Lacking  Psychomotor Activity:  Decreased  Concentration:  Fair  Recall:  FiservFair  Fund of Knowledge:Fair  Language: Good  Akathisia:  No  Handed:  Right  AIMS (if indicated):     Assets:  Leisure Time Physical Health Resilience  Sleep:  Number of Hours: 6.25   Musculoskeletal: Strength & Muscle Tone: within normal limits Gait & Station: normal Patient leans: N/A  Current Medications: Current Facility-Administered Medications  Medication Dose Route Frequency Provider Last Rate Last Dose  . acetaminophen (TYLENOL) tablet 650 mg  650 mg Oral Q6H PRN Kerry HoughSpencer E Simon, PA-C      . alum & mag hydroxide-simeth (MAALOX/MYLANTA) 200-200-20 MG/5ML suspension 30 mL  30 mL Oral Q4H PRN Kerry HoughSpencer E Simon, PA-C      . buPROPion [facility](WELLBUTRIN SR) 12 hr tablet 100 mg  100 mg Oral Daily Nehemiah SettleJanardhaha R Camren Lipsett, MD   100 mg at 09/04/13 0756  . hydrOXYzine (ATARAX/VISTARIL) tablet 25 mg  25 mg Oral Q6H PRN Beau FannyJohn C Withrow, FNP   25 mg at  09/03/13 1627  . magnesium hydroxide (MILK OF MAGNESIA) suspension 30 mL  30 mL Oral Daily PRN Kerry Hough, PA-C      . traZODone (DESYREL) tablet 50 mg  50 mg Oral QHS,MR X 1 Kerry Hough, PA-C   50 mg at 09/03/13 2201    Lab Results: No results found for this or any previous visit (from the past 48 hour(s)).  Physical Findings: AIMS: Facial and Oral Movements Muscles of Facial Expression: None, normal Lips and  Perioral Area: None, normal Jaw: None, normal Tongue: None, normal,Extremity Movements Upper (arms, wrists, hands, fingers): None, normal Lower (legs, knees, ankles, toes): None, normal, Trunk Movements Neck, shoulders, hips: None, normal, Overall Severity Severity of abnormal movements (highest score from questions above): None, normal Incapacitation due to abnormal movements: None, normal Patient's awareness of abnormal movements (rate only patient's report): No Awareness, Dental Status Current problems with teeth and/or dentures?: No Does patient usually wear dentures?: No  CIWA:  CIWA-Ar Total: 1 COWS:  COWS Total Score: 1  Treatment Plan Summary: Daily contact with patient to assess and evaluate symptoms and progress in treatment Medication management  Plan:  Review of chart, vital signs, medications, and notes. 1-Individual and group therapy 2-Medication management for depression and anxiety:  Medications reviewed with the patient and his Trazodone increased for sleep issues 3-Coping skills for depression, anxiety 4-Continue crisis stabilization and management 5-Address health issues--monitoring vital signs, stable 6-Treatment plan in progress to prevent relapse of depression and anxiety  Medical Decision Making Problem Points:  Established problem, stable/improving (1) and Review of psycho-social stressors (1) Data Points:  Review of medication regiment & side effects (2)  I certify that inpatient services furnished can reasonably be expected to improve the patient's condition.   Nanine Means, PMH-NP 09/04/2013, 5:37 PM  Reviewed the information documented and agree with the treatment plan.  Sua Spadafora,JANARDHAHA R. 09/05/2013 10:01 AM

## 2013-09-05 NOTE — Progress Notes (Signed)
Adult Psychoeducational Group Note  Date:  09/05/2013 Time:  8:00 pm  Group Topic/Focus:  Wrap-Up Group:   The focus of this group is to help patients review their daily goal of treatment and discuss progress on daily workbooks.  Participation Level:  Active  Participation Quality:  Appropriate and Sharing  Affect:  Appropriate  Cognitive:  Appropriate  Insight: Appropriate  Engagement in Group:  Engaged  Modes of Intervention:  Discussion, Education, Socialization and Support  Additional Comments:  Pt stated that the two coping skills that he has learned while in the hospital are to play sports and to read.   Abbey Veith 09/05/2013, 9:04 PM

## 2013-09-05 NOTE — Progress Notes (Signed)
Pt has been going to groups and and stated his depression is 4/10 and his hopelessness is a 5/10. Pt would like to find an alternate place to live as he feels stressed living with his mother and sibling. He had two jobs but lost both so presently is not working. He has been attending groups  And would like to keep himself from being isolated. He has been in the hall conversing with the other pts. And staff. He denies SI and HI and contracts for safety

## 2013-09-05 NOTE — [facility] Group Notes (Signed)
[facility] Group Notes:  (Clinical Social Work)  09/05/2013   1:15-2:15PM  Summary of Progress/Problems:   The main focus of today's process group was for the patient to identify ways in which they have sabotaged their own mental health wellness/recovery.  Motivational interviewing and a handout were used to explore the benefits and costs of their self-sabotaging behavior as well as the benefits and costs of changing this behavior.  The Stages of Change were explained to the group using a handout, and patients identified where they are with regard to changing self-defeating behaviors.  The patient expressed he self-sabotages with isolation, being a loner, and sleeping all the time.  He stated "less friends means less BS."  He feels now that he is getting older that he needs friends, and wants to change this.  Type of Therapy:  Process Group  Participation Level:  Active  Participation Quality:  Attentive  Affect:  Blunted and Depressed  Cognitive:  Oriented  Insight:  Developing/Improving  Engagement in Therapy:  Engaged  Modes of Intervention:  Education, Motivational Interviewing   Eugene MantleMareida Grossman-Orr, LCSW 09/05/2013, 4:00pm

## 2013-09-05 NOTE — [facility] Group Notes (Signed)
[facility] Group Notes:  (Nursing/MHT/Case Management/Adjunct)  Date:  09/05/2013  Time:  9:55 AM  Type of Therapy:  Group Therapy  Participation Level:  Active  Participation Quality:  Appropriate  Affect:  Appropriate  Cognitive:  Alert  Insight:  Appropriate  Engagement in Group:  Engaged  Modes of Intervention:  Discussion  Summary of Progress/Problems:Pt stated he was just chillin and feeling better.Marland Kitchen. He is trying to focus on ways to deal with his depression.  Rodman KeyWebb, Nilsa Macht [facility] 09/05/2013, 9:55 AM

## 2013-09-05 NOTE — Progress Notes (Addendum)
[facility] MD Progress Note  09/05/2013 3:15 PM Eugene Sanders  MRN:  [id number] Subjective:  Patient was seen and chart reviewed.  Calm and cooperative.  Patient reports depression and anxiety is "better".  He is ready to discharge but verbalizes worry about what he will do when he gets out.  He wants a job and a place of his own,he now lives with his mother.  He is sleeping and eating well. Diagnosis:   DSM5: Substance/Addictive Disorders:  Alcohol Related Disorder - Moderate (303.90) Depressive Disorders:  Major Depressive Disorder - Severe (296.23) Total Time spent with patient: 30 minutes  Axis I: Alcohol Abuse, Anxiety Disorder NOS, Depressive Disorder NOS and Substance Abuse Axis II: Deferred Axis III:  Past Medical History  Diagnosis Date  . Asthma   . Depression   . Anxiety    Axis IV: economic problems, educational problems, housing problems, occupational problems, other psychosocial or environmental problems, problems with access to health care services and problems with primary support group Axis V: 41-50 serious symptoms  ADL's:  Intact  Sleep: Good  Appetite:  Good  Suicidal Ideation: denies presently   Homicidal Ideation: denies presently   AEB (as evidenced by):  Psychiatric Specialty Exam: Physical Exam  Constitutional: He is oriented to person, place, and time. He appears well-developed and well-nourished.  HENT:  Head: Normocephalic and atraumatic.  Neck: Normal range of motion. Neck supple.  Respiratory: Effort normal.  Genitourinary:  Deferred, no complaints  Musculoskeletal: Normal range of motion.  Neurological: He is alert and oriented to person, place, and time.  Skin: Skin is warm.    Review of Systems  Constitutional: Negative.   HENT: Negative.   Eyes: Negative.   Respiratory: Negative.   Cardiovascular: Negative.   Gastrointestinal: Negative.   Genitourinary: Negative.   Musculoskeletal: Negative.   Skin: Negative.   Neurological:  Negative.   Endo/Heme/Allergies: Negative.   Psychiatric/Behavioral: Positive for depression, suicidal ideas and substance abuse. Negative for hallucinations. The patient is nervous/anxious.     Blood pressure 115/74, pulse 99, temperature 97.9 F (36.6 C), temperature source Oral, resp. rate 16, height 5' 8.25" (1.734 m), weight 72.122 kg (159 lb), SpO2 98.00%.Body mass index is 23.99 kg/(m^2).  General Appearance: Casual  Eye Contact::  Good  Speech:  Clear and Coherent  Volume:  Normal  Mood:  Depressed  Affect:  Depressed  Thought Process:  Goal Directed  Orientation:  Full (Time, Place, and Person)  Thought Content:  Negative  Suicidal Thoughts:  No  Homicidal Thoughts:  No  Memory:  Immediate;   Good Recent;   Good Remote;   Good  Judgement:  Fair  Insight:  Fair  Psychomotor Activity:  Normal  Concentration:  Good  Recall:  Good  Fund of Knowledge:Good  Language: Good  Akathisia:  No  Handed:  Right  AIMS (if indicated):     Assets:  Desire for Improvement Resilience  Sleep:  Number of Hours: 6   Musculoskeletal: Strength & Muscle Tone: within normal limits Gait & Station: normal Patient leans: N/A  Current Medications: Current Facility-Administered Medications  Medication Dose Route Frequency Provider Last Rate Last Dose  . acetaminophen (TYLENOL) tablet 650 mg  650 mg Oral Q6H PRN Kerry Hough, PA-C      . alum & mag hydroxide-simeth (MAALOX/MYLANTA) 200-200-20 MG/5ML suspension 30 mL  30 mL Oral Q4H PRN Kerry Hough, PA-C      . buPROPion [facility] SR) 12 hr tablet 100 mg  100 mg  Oral Daily Nehemiah SettleJanardhaha R Jonnalagadda, MD   100 mg at 09/05/13 [id number]  . hydrOXYzine (ATARAX/VISTARIL) tablet 25 mg  25 mg Oral Q6H PRN Beau FannyJohn C Withrow, FNP   25 mg at 09/03/13 1627  . magnesium hydroxide (MILK OF MAGNESIA) suspension 30 mL  30 mL Oral Daily PRN Kerry HoughSpencer E Simon, PA-C      . traZODone (DESYREL) tablet 100 mg  100 mg Oral QHS,MR X 1 Nanine MeansJamison Lord, NP   100 mg at  09/04/13 2208    Lab Results: No results found for this or any previous visit (from the past 48 hour(s)).  Physical Findings: AIMS: Facial and Oral Movements Muscles of Facial Expression: None, normal Lips and Perioral Area: None, normal Jaw: None, normal Tongue: None, normal,Extremity Movements Upper (arms, wrists, hands, fingers): None, normal Lower (legs, knees, ankles, toes): None, normal, Trunk Movements Neck, shoulders, hips: None, normal, Overall Severity Severity of abnormal movements (highest score from questions above): None, normal Incapacitation due to abnormal movements: None, normal Patient's awareness of abnormal movements (rate only patient's report): No Awareness, Dental Status Current problems with teeth and/or dentures?: No Does patient usually wear dentures?: No  CIWA:  CIWA-Ar Total: 1 COWS:  COWS Total Score: 1  Treatment Plan Summary: Daily contact with patient to assess and evaluate symptoms and progress in treatment Medication management Review of chart, vital signs, medications and notes  Plan: Treatment plan /Recommendations  1. Continue crisis management and stabilization. Estimated length of stay 5-7 days  2.  Medication management to reduce current symptoms to base line and improve patient's overall level of functioning.   Medications reviewed with the apteint and no untoward effects -continue Wellbutrin SR 100 mg bid -continue Trazodone 100 mg qhs -continue Viteral 25 mg every 6 hrs po prn anxiety   3.  Treat health problems as indicated. 4.  Develop treatment plan to decrease risk of relapse upon discharge and the need for readmission       Individual and group therapy encouraged      Coping skills for depression, substance abuse, and anxiety  5.  Psych-social education regarding relapse prevention and self care. 6.  Health care follow up as needed for medical problems 7.  Continue home medications where appropriate 8.  Disposition in  progress  Medical Decision Making Problem Points:  Established problem, stable/improving (1) and Review of psycho-social stressors (1) Data Points:  Review or order clinical lab tests (1) Review of medication regiment & side effects (2) Review of new medications or change in dosage (2)  I certify that inpatient services furnished Eugene reasonably be expected to improve the patient's condition.   Lorinda CreedLARACH, MARY  PMHNP 09/05/2013, 3:15 PM I agreed with findings and treatment plan of this patient

## 2013-09-05 NOTE — Progress Notes (Signed)
Adult Psychoeducational Group Note  Date:  09/05/2013 Time:  4:07 PM  Group Topic/Focus:  Emotional Education:   The focus of this group is to discuss what feelings/emotions are, and how they are experienced.  Participation Level:  Active  Participation Quality:  Appropriate  Affect:  Appropriate  Cognitive:  Appropriate  Insight: Appropriate  Engagement in Group:  Engaged  Modes of Intervention:  Activity and Education  Additional Comments:  Patient took the [employer] Languages Quiz from the packet and got a three way tie between Words of Affirmation, Quality of Time and Acts of Service for his love languages. Patient said he was surprised he got so many but he felt they were all important to him.  Merleen MillinerCataldo, Russel Morain Y 09/05/2013, 4:07 PM

## 2013-09-05 NOTE — Progress Notes (Signed)
Writer observed patient sitting in the dayroom watching tv, spoke with him 1:1 and he reports that his day has been good. His mother visited on today and he reports that she is supportive but he is looking into finding his own place and wants the help of his social worker to find resources. 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 Writer encouraged him to continue with his schooling and allow his mother to be his support. He was receptive to advice. He currently denies si/hi/a/v hallucinations. Safety maintained on unit with 15 min checks.

## 2013-09-05 NOTE — [facility] Group Notes (Signed)
[facility] Group Notes:  (Nursing/MHT/Case Management/Adjunct)  Date:  09/05/2013  Time:  11:01 AM  Type of Therapy:  Nurse Education  Participation Level:  Active  Participation Quality:  Appropriate  Affect:  Appropriate  Cognitive:  Appropriate  Insight:  Appropriate  Engagement in Group:  Engaged  Modes of Intervention:  Discussion  Summary of Progress/Problems:  Nicole CellaWebb, Alissa Pharr Guyes 09/05/2013, 11:01 AM

## 2013-09-06 NOTE — [facility] Group Notes (Signed)
[facility] Group Notes:  (Nursing/MHT/Case Management/Adjunct)  Date:  09/06/2013  Time:  2:04 PM  Type of Therapy:  Psychoeducational Skills  Participation Level:  Minimal  Participation Quality:  Appropriate and Attentive  Affect:  Appropriate  Cognitive:  Appropriate  Insight:  Appropriate  Engagement in Group:  Engaged  Modes of Intervention:  Confrontation, Discussion and Exploration  Summary of Progress/Problems:focus of this group was on identifying healthy support systems to aid in recovery with mental illness.    Malva LimesStrader, Tauri Ethington 09/06/2013, 2:04 PM

## 2013-09-06 NOTE — Progress Notes (Signed)
Patient has been up and active on the unit and has voiced no complaints. He reports that he was hoping that he would have been discharge today but since he wasn't he is hoping that Monday he will be discharged. He plans to stop smoking thc and drinking alcohol.  Patient currently denies having pain, -si/hi/a/v hall. Support and encouragement offered, safety maintained on unit, will continue to monitor.

## 2013-09-06 NOTE — Progress Notes (Signed)
Patient ID: Eugene Sanders, male   DOB: 03/25/1991, 23 y.o.   [id number] [facility]  09/06/2013 8:44 AM Eugene Sanders  MRN:  [id number] Subjective:  Patient was seen and chart reviewed.  Calm and cooperative.  Patient reports depression and anxiety is "better".  He is ready to discharge but verbalizes worry about what he will do when he gets out.  He wants a job and a place of his own,he now lives with his mother.  He is sleeping and eating well. Today he voices interest in attending the groups on the 300 floor, he understands that unit is more specific to addiction issues.  He is instructed on when the groups will meet. Nursing is made aware.  He rates Depression 2/10 and Anxiety 4/10.  He plans on asking for help in securing a sponsor for support on discharge  Diagnosis:   DSM5: Substance/Addictive Disorders:  Alcohol Related Disorder - Moderate (303.90) Depressive Disorders:  Major Depressive Disorder - Severe (296.23) Total Time spent with patient: 30 minutes  Axis I: Alcohol Abuse, Anxiety Disorder NOS, Depressive Disorder NOS and Substance Abuse Axis II: Deferred Axis III:  Past Medical History  Diagnosis Date  . Asthma   . Depression   . Anxiety    Axis IV: economic problems, educational problems, housing problems, occupational problems, other psychosocial or environmental problems, problems with access to health care services and problems with primary support group Axis V: 41-50 serious symptoms  ADL's:  Intact  Sleep: Good  Appetite:  Good  Suicidal Ideation: denies presently   Homicidal Ideation: denies presently   AEB (as evidenced by):  Psychiatric Specialty Exam: Physical Exam  Constitutional: He is oriented to person, place, and time. He appears well-developed and well-nourished.  HENT:  Head: Normocephalic and atraumatic.  Neck: Normal range of motion. Neck supple.  Respiratory: Effort normal.  Genitourinary:  Deferred, no complaints   Musculoskeletal: Normal range of motion.  Neurological: He is alert and oriented to person, place, and time.  Skin: Skin is warm.    Review of Systems  Constitutional: Negative.   HENT: Negative.   Eyes: Negative.   Respiratory: Negative.   Cardiovascular: Negative.   Gastrointestinal: Negative.   Genitourinary: Negative.   Musculoskeletal: Negative.   Skin: Negative.   Neurological: Negative.   Endo/Heme/Allergies: Negative.   Psychiatric/Behavioral: Positive for depression, suicidal ideas and substance abuse. Negative for hallucinations. The patient is nervous/anxious.     Blood pressure 117/67, pulse 101, temperature 97.9 F (36.6 C), temperature source Oral, resp. rate 16, height 5' 8.25" (1.734 m), weight 72.122 kg (159 lb), SpO2 98.00%.Body mass index is 23.99 kg/(m^2).  General Appearance: Casual  Eye Contact::  Good  Speech:  Clear and Coherent  Volume:  Normal  Mood:  Depressed  Affect:  Depressed  Thought Process:  Goal Directed  Orientation:  Full (Time, Place, and Person)  Thought Content:  Negative  Suicidal Thoughts:  No  Homicidal Thoughts:  No  Memory:  Immediate;   Good Recent;   Good Remote;   Good  Judgement:  Fair  Insight:  Fair  Psychomotor Activity:  Normal  Concentration:  Good  Recall:  Good  Fund of Knowledge:Good  Language: Good  Akathisia:  No  Handed:  Right  AIMS (if indicated):     Assets:  Desire for Improvement Resilience  Sleep:  Number of Hours: 6   Musculoskeletal: Strength & Muscle Tone: within normal limits Gait & Station: normal Patient leans: N/A  Current Medications: Current Facility-Administered Medications  Medication Dose Route Frequency Provider Last Rate Last Dose  . acetaminophen (TYLENOL) tablet 650 mg  650 mg Oral Q6H PRN Kerry Hough, PA-C      . alum & mag hydroxide-simeth (MAALOX/MYLANTA) 200-200-20 MG/5ML suspension 30 mL  30 mL Oral Q4H PRN Kerry Hough, PA-C      . buPROPion [facility] SR) 12 hr  tablet 100 mg  100 mg Oral Daily Nehemiah Settle, MD   100 mg at 09/06/13 1610  . hydrOXYzine (ATARAX/VISTARIL) tablet 25 mg  25 mg Oral Q6H PRN Beau Fanny, FNP   25 mg at 09/03/13 1627  . magnesium hydroxide (MILK OF MAGNESIA) suspension 30 mL  30 mL Oral Daily PRN Kerry Hough, PA-C      . traZODone (DESYREL) tablet 100 mg  100 mg Oral QHS,MR X 1 Nanine Means, NP   100 mg at 09/05/13 2215    Lab Results: No results found for this or any previous visit (from the past 48 hour(s)).  Physical Findings: AIMS: Facial and Oral Movements Muscles of Facial Expression: None, normal Lips and Perioral Area: None, normal Jaw: None, normal Tongue: None, normal,Extremity Movements Upper (arms, wrists, hands, fingers): None, normal Lower (legs, knees, ankles, toes): None, normal, Trunk Movements Neck, shoulders, hips: None, normal, Overall Severity Severity of abnormal movements (highest score from questions above): None, normal Incapacitation due to abnormal movements: None, normal Patient's awareness of abnormal movements (rate only patient's report): No Awareness, Dental Status Current problems with teeth and/or dentures?: No Does patient usually wear dentures?: No  CIWA:  CIWA-Ar Total: 1 COWS:  COWS Total Score: 1  Treatment Plan Summary: Daily contact with patient to assess and evaluate symptoms and progress in treatment Medication management Review of chart, vital signs, medications and notes  Plan: 1. Continue crisis management and stabilization. Estimated length of stay 5-7 days  2.  Medication management to reduce current symptoms to base line and improve patient's overall level of functioning.   Medications reviewed with the apteint and no untoward effects -continue Wellbutrin SR 100 mg bid -continue Trazodone 100 mg qhs -continue Viteral 25 mg every 6 hrs po prn anxiety 3.  Treat health problems as indicated. 4.  Develop treatment plan to decrease risk of relapse  upon discharge and the need for readmission       Individual and group therapy encouraged      Coping skills for depression, substance abuse, and anxiety       We discussed the importance of  Securing support once discharged.  He is open to attending 12 step meetings 5.  Psych-social education regarding relapse prevention and self care. 6.  Health care follow up as needed for medical problems 7.  Continue home medications where appropriate 8.  Disposition in progress  Medical Decision Making Problem Points:  Established problem, stable/improving (1) and Review of psycho-social stressors (1) Data Points:  Review or order clinical lab tests (1) Review of medication regiment & side effects (2) Review of new medications or change in dosage (2)  I certify that inpatient services furnished can reasonably be expected to improve the patient's condition.   Lorinda Creed  PMHNP 09/06/2013, 8:44 AM I agreed with findings and treatment plan of this patient.

## 2013-09-06 NOTE — Progress Notes (Signed)
[name] IS MORE RELAXED AS THE DAY WEARS ON. He IS COOPERATIVE, PLEASANT AND TAKES HIS MEDS AS ORDERED.   A hE DENIES si, RATES HIS DEPRESSION AND HOPELESSNESS 2/2 AND SAYS HIS dc PLAN IS TO  FOCUS ON HIMSLEF.   r sAFETY IS IN PLACE AND POC MOVES FORWARD.

## 2013-09-06 NOTE — Progress Notes (Signed)
Patient ID: Eugene Sanders, male   DOB: May 25, 1991, 23 y.o.   MRN: [id number] [facility] MD Progress Note  09/03/2013 11:42 AM Eugene Sanders  MRN:  [id number] Subjective:  Pt continues to report periods of tearfulness with random bouts of crying. Pt reports that since awaking today, no SI, but some last night. Denies HI and AVH. Pt rates anxiety at 7/10 and depression at 6/10, but appears very depressed and may be minimizing symptoms. Pt reports intermittently poor sleep. Contracts for safety at this time. Pt is in agreement with medication and treatment plan and expresses an interest in group and individual therapy, although he has not attended every session thus far. Will continue to monitor and encourage participation.  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 Asthma   . Depression   . Anxiety    Axis IV: other psychosocial or environmental problems, problems related to social environment and problems with primary support group Axis V: 41-50 serious symptoms  ADL's:  Intact  Sleep: Poor  Appetite:  Fair  Suicidal Ideation:  Denies Homicidal Ideation:  Denies  Psychiatric Specialty Exam: Physical Exam  Constitutional: He is oriented to person, place, and time. He appears well-developed and well-nourished.  HENT:  Head: Normocephalic and atraumatic.  Neck: Normal range of motion.  Musculoskeletal: Normal range of motion.  Neurological: He is alert and oriented to person, place, and time.  Skin: Skin is warm and dry.    Review of Systems  Constitutional: Negative.   HENT: Negative.   Eyes:  Negative.   Respiratory: Negative.   Cardiovascular: Negative.   Gastrointestinal: Negative.   Genitourinary: Negative.   Musculoskeletal: Negative.   Skin: Negative.   Neurological: Negative.   Endo/Heme/Allergies: Negative.   Psychiatric/Behavioral: Positive for depression and substance abuse.    Blood pressure 115/74, pulse 99, temperature 97.9 F (36.6 C), temperature source Oral, resp. rate 16, height 5' 8.25" (1.734 m), weight 72.122 kg (159 lb), SpO2 98.00%.Body mass index is 23.99 kg/(m^2).  General Appearance: Casual  Eye Contact::  Fair  Speech:  Normal Rate  Volume:  Normal  Mood:  Anxious and Depressed  Affect:  Congruent  Thought Process:  Coherent  Orientation:  Full (Time, Place, and Person)  Thought Content:  WDL  Suicidal Thoughts:  No  Homicidal Thoughts:  No  Memory:  Immediate;   Fair Recent;   Fair Remote;   Fair  Judgement:  Poor  Insight:  Lacking  Psychomotor Activity:  Decreased  Concentration:  Fair  Recall:  [employer] of Knowledge:Fair  Language: Good  Akathisia:  No  Handed:  Right  AIMS (if indicated):     Assets:  Leisure Time Physical Health Resilience  Sleep:  Number of Hours: 6   Musculoskeletal: Strength & Muscle Tone: within normal limits Gait & Station: normal Patient leans: N/A  Current Medications: Current Facility-Administered Medications  Medication Dose Route Frequency Provider Last Rate Last Dose  . acetaminophen (TYLENOL) tablet 650 mg  650 mg Oral Q6H PRN Kerry Hough, PA-C      . alum & mag hydroxide-simeth (MAALOX/MYLANTA) 200-200-20 MG/5ML suspension 30 mL  30 mL Oral Q4H  PRN Kerry HoughSpencer E Simon, PA-C      . buPROPion [facility](WELLBUTRIN SR) 12 hr tablet 100 mg  100 mg Oral Daily Nehemiah SettleJanardhaha R Atonya Templer, MD   100 mg at 09/05/13 [id number]  . hydrOXYzine (ATARAX/VISTARIL) tablet 25 mg  25 mg Oral Q6H PRN Beau FannyJohn C Withrow, FNP   25 mg at 09/03/13 1627  . magnesium hydroxide (MILK OF MAGNESIA) suspension 30 mL  30 mL Oral Daily PRN  Kerry HoughSpencer E Simon, PA-C      . traZODone (DESYREL) tablet 100 mg  100 mg Oral QHS,MR X 1 Nanine MeansJamison Lord, NP   100 mg at 09/05/13 2215    Lab Results: No results found for this or any previous visit (from the past 48 hour(s)).  Physical Findings: AIMS: Facial and Oral Movements Muscles of Facial Expression: None, normal Lips and Perioral Area: None, normal Jaw: None, normal Tongue: None, normal,Extremity Movements Upper (arms, wrists, hands, fingers): None, normal Lower (legs, knees, ankles, toes): None, normal, Trunk Movements Neck, shoulders, hips: None, normal, Overall Severity Severity of abnormal movements (highest score from questions above): None, normal Incapacitation due to abnormal movements: None, normal Patient's awareness of abnormal movements (rate only patient's report): No Awareness, Dental Status Current problems with teeth and/or dentures?: No Does patient usually wear dentures?: No  CIWA:  CIWA-Ar Total: 1 COWS:  COWS Total Score: 1  Treatment Plan Summary: Daily contact with patient to assess and evaluate symptoms and progress in treatment Medication management  Plan:  Review of chart, vital signs, medications, and notes. 1-Individual and group therapy 2-Medication management for depression and anxiety:  Medications reviewed with the patient and his Trazodone increased for sleep issues 3-Coping skills for depression, anxiety 4-Continue crisis stabilization and management 5-Address health issues--monitoring vital signs, stable 6-Treatment plan in progress to prevent relapse of depression and anxiety  Medical Decision Making Problem Points:  Established problem, stable/improving (1), Review of last therapy session (1) and Review of psycho-social stressors (1) Data Points:  Review or order clinical lab tests (1) Review of medication regiment & side effects (2) Review of new medications or change in dosage (2)  I certify that inpatient services furnished can  reasonably be expected to improve the patient's condition.   Reviewed the information documented and agree with the treatment plan.  Beau FannyWithrow, John C, FNP-BC 09/03/2013 11:42 AM  Addendum: Error with computer system. Note pended, then vanished. Had to retype entire note. Support ticket submitted to IT department.   Reviewed the information documented and agree with the treatment plan.  Collin Hendley,JANARDHAHA R. 09/07/2013 4:58 PM

## 2013-09-06 NOTE — [facility] Group Notes (Signed)
[facility] Group Notes:  (Clinical Social Work)  09/06/2013  10:00-11:00AM - 300 hall group  Summary of Progress/Problems:   The main focus of today's process group was to   identify the patient's current support system and decide on other supports that can be put in place.  The picture on workbook was used to discuss why additional supports are neededt.  An emphasis was placed on using counselor, doctor, therapy groups, 12-step groups, and problem-specific support groups to expand supports.   There was also an extensive discussion about what constitutes a healthy support versus an unhealthy support.  The patient expressed full comprehension of the concepts presented, and agreed that there is a need to add more supports.  The patient stated the current supports in place are a mentor he has had since middle school.  He would like information on Emotions Anonymous and a depression support group, and will "consider" going to AA, does not really feel he needs that.  Type of Therapy:  Process Group with Motivational Interviewing  Participation Level:  Active  Participation Quality:  Attentive and Sharing  Affect:  Blunted  Cognitive:  Appropriate  Insight:  Developing/Improving  Engagement in Therapy:  Engaged  Modes of Intervention:   Education, Support and Processing, Activity  Pilgrim's PrideMareida Grossman-Orr, LCSW 09/06/2013, 12:15pm

## 2013-09-06 NOTE — Progress Notes (Signed)
Adult Psychoeducational Group Note  Date:  09/06/2013 Time:  3:36 PM  Group Topic/Focus:  Making Healthy Choices:   The focus of this group is to help patients identify negative/unhealthy choices they were using prior to admission and identify positive/healthier coping strategies to replace them upon discharge.  Participation Level:  Did Not Attend  Eugene Sanders 09/06/2013, 3:36 PM 

## 2013-09-06 NOTE — Progress Notes (Signed)
[facility] Group Notes:  (Nursing/MHT/Case Management/Adjunct)  Date:  09/06/2013  Time:  6:39 PM  Type of Therapy:  Psychoeducational Skills  Participation Level:  Appropriate  Participation Quality:  Appropriate and Attentive  Affect:  Appropriate  Cognitive:  Appropriate  Insight:  Appropriate  Engagement in Group:  Engaged  Modes of Intervention:  Activity  Summary of Progress/Problems: Pts played an activity of Pictionary using coping skills. Pts were asked one coping skill they could use once they leave the hospital that works for them. Pt stated he really enjoys playing basketball and has stopped playing recently and would like to start playing more again.  Caswell CorwinOwen, Elinda Bunten C 09/06/2013, 6:39 PM

## 2013-09-07 MED ORDER — BUPROPION HCL ER (SR) 150 MG PO TB12
150.0000 mg | ORAL_TABLET | Freq: Every day | ORAL | Status: DC
  Administered 2013-09-08: 150 mg via ORAL
  Filled 2013-09-07: qty 1
  Filled 2013-09-07: qty 14
  Filled 2013-09-07: qty 1

## 2013-09-07 NOTE — Progress Notes (Signed)
Patient ID: Eugene Sanders, male   DOB: [birth year], 23 y.o.   MRN: [id number] Patient ID: Eugene Sanders, male   DOB: [birth year], 23 y.o.   MRN: [id number] [facility] MD Progress Note  09/07/2013 4:09 PM Farron Watrous  MRN:  [id number] Subjective:  Patient was seen and chart reviewed.  Patient has been suffering with major depressive disorder, cannabis abuse and a recent history of suicide attempt by overdose of cough medication.   Patient has been compliant with this or inpatient psychiatric treatment including group therapies and medication management. Patient reported he continued to have a craving for drugs of abuse and continued to have a depression at this time. Patient reported he has been attending group sessions for substance abuse and mood disorder. Patient reportedly benefiting from the groups and learning coping skills. Patient has been anxious and worried about being discharged and getting relapsed. Patient has improved sleep and appetite with the help of medication. Staff reported he slept 4.5 hours last night. Patient has been worried about his psychosocial situation especially finances and lack of job. Patient has minimized some of these his symptoms and rates Depression 2/10 and Anxiety 4/10. He plans on seeking a sponsor for support on discharge for participating in a substance abuse support groups.   Diagnosis:   DSM5: Substance/Addictive Disorders:  Alcohol Related Disorder - Moderate (303.90) Depressive Disorders:  Major Depressive Disorder - Severe (296.23) Total Time spent with patient: 30 minutes  Axis I: Alcohol Abuse, Anxiety Disorder NOS, Depressive Disorder NOS and Substance Abuse Axis II: Deferred Axis III:  Past Medical History  Diagnosis Date  . Asthma   . Depression   . Anxiety    Axis IV: economic problems, educational problems, housing problems, occupational problems, other psychosocial or environmental problems, problems with access to health care services and problems  with primary support group Axis V: 41-50 serious symptoms  ADL's:  Intact  Sleep: Good  Appetite:  Good  Suicidal Ideation: denies presently   Homicidal Ideation: denies presently   AEB (as evidenced by):  Psychiatric Specialty Exam: Physical Exam  Constitutional: He is oriented to person, place, and time. He appears well-developed and well-nourished.  HENT:  Head: Normocephalic and atraumatic.  Neck: Normal range of motion. Neck supple.  Respiratory: Effort normal.  Genitourinary:  Deferred, no complaints  Musculoskeletal: Normal range of motion.  Neurological: He is alert and oriented to person, place, and time.  Skin: Skin is warm.    Review of Systems  Constitutional: Negative.   HENT: Negative.   Eyes: Negative.   Respiratory: Negative.   Cardiovascular: Negative.   Gastrointestinal: Negative.   Genitourinary: Negative.   Musculoskeletal: Negative.   Skin: Negative.   Neurological: Negative.   Endo/Heme/Allergies: Negative.   Psychiatric/Behavioral: Positive for depression, suicidal ideas and substance abuse. Negative for hallucinations. The patient is nervous/anxious.     Blood pressure 117/76, pulse 100, temperature 97.6 F (36.4 C), temperature source Oral, resp. rate 18, height 5' 8.25" (1.734 m), weight 72.122 kg (159 lb), SpO2 98.00%.Body mass index is 23.99 kg/(m^2).  General Appearance: Casual  Eye Contact::  Good  Speech:  Clear and Coherent  Volume:  Normal  Mood:  Depressed  Affect:  Depressed  Thought Process:  Goal Directed  Orientation:  Full (Time, Place, and Person)  Thought Content:  Negative  Suicidal Thoughts:  No, contracts safety while in the hospital   Homicidal Thoughts:  No  Memory:  Immediate;   Good Recent;   Good Remote;   Good  Judgement:  Fair  Insight:  Fair  Psychomotor Activity:  Normal  Concentration:  Good  Recall:  Good  Fund of Knowledge:Good  Language: Good  Akathisia:  No  Handed:  Right  AIMS (if  indicated):     Assets:  Desire for Improvement Resilience  Sleep:  Number of Hours: 4.5   Musculoskeletal: Strength & Muscle Tone: within normal limits Gait & Station: normal Patient leans: N/A  Current Medications: Current Facility-Administered Medications  Medication Dose Route Frequency Provider Last Rate Last Dose  . acetaminophen (TYLENOL) tablet 650 mg  650 mg Oral Q6H PRN Kerry Hough, PA-C      . alum & mag hydroxide-simeth (MAALOX/MYLANTA) 200-200-20 MG/5ML suspension 30 mL  30 mL Oral Q4H PRN Kerry Hough, PA-C      . buPROPion [facility] SR) 12 hr tablet 100 mg  100 mg Oral Daily Nehemiah Settle, MD   100 mg at 09/07/13 0756  . hydrOXYzine (ATARAX/VISTARIL) tablet 25 mg  25 mg Oral Q6H PRN Beau Fanny, FNP   25 mg at 09/03/13 1627  . magnesium hydroxide (MILK OF MAGNESIA) suspension 30 mL  30 mL Oral Daily PRN Kerry Hough, PA-C      . traZODone (DESYREL) tablet 100 mg  100 mg Oral QHS,MR X 1 Nanine Means, NP   100 mg at 09/06/13 2210    Lab Results: No results found for this or any previous visit (from the past 48 hour(s)).  Physical Findings: AIMS: Facial and Oral Movements Muscles of Facial Expression: None, normal Lips and Perioral Area: None, normal Jaw: None, normal Tongue: None, normal,Extremity Movements Upper (arms, wrists, hands, fingers): None, normal Lower (legs, knees, ankles, toes): None, normal, Trunk Movements Neck, shoulders, hips: None, normal, Overall Severity Severity of abnormal movements (highest score from questions above): None, normal Incapacitation due to abnormal movements: None, normal Patient's awareness of abnormal movements (rate only patient's report): No Awareness, Dental Status Current problems with teeth and/or dentures?: No Does patient usually wear dentures?: No  CIWA:  CIWA-Ar Total: 1 COWS:  COWS Total Score: 2  Treatment Plan Summary: Daily contact with patient to assess and evaluate symptoms and  progress in treatment Medication management for depression, anxiety, insomnia and substance abuse   Review of chart, vital signs, medications and notes Plan: 1. Continue crisis management and stabilization. Estimated length of stay 5-7 days  2.  Medication management to reduce current symptoms to base line and improve patient's overall level of functioning.   Medications reviewed with the apteint and no untoward effects - increase  Wellbutrin SR 150  mg  for better control of the  -continue Trazodone 100 mg qhs4 and insomnia  -continue Viteral 25 mg every 6 hrs po prn anxiety 3.  Treat health problems as indicated. 4.  Develop treatment plan to decrease risk of relapse upon discharge and the need for readmission       Individual and group therapy encouraged      Coping skills for depression, substance abuse, and anxiety       We discussed the importance of  Securing support once discharged.  He is open to attending 12 step meetings 5.  Psych-social education regarding relapse prevention and self care. 6.  Health care follow up as needed for medical problems 7.  Continue home medications where appropriate 8.  Disposition in progress, May be discharged tomorrow if you continue to contract for safety and able to show clinical improvement.   Medical Decision  Making Problem Points:  Established problem, stable/improving (1) and Review of psycho-social stressors (1) Data Points:  Review or order clinical lab tests (1) Review of medication regiment & side effects (2) Review of new medications or change in dosage (2)  I certify that inpatient services furnished can reasonably be expected to improve the patient's condition.   Donnarae Rae,JANARDHAHA R.   09/07/2013, 4:09 PM

## 2013-09-07 NOTE — [facility] Group Notes (Signed)
[facility] LCSW Group Therapy          Overcoming Obstacles       1:15 -2:30        09/07/2013   3:29 PM     Type of Therapy:  Group Therapy  Participation Level:  Appropriate  Participation Quality:  Appropriate  Affect:  Appropriate, Alert  Cognitive:  Attentive Appropriate  Insight: Developing/Improving Engaged  Engagement in Therapy: Developing/Imprvoing Engaged  Modes of Intervention:  Discussion Exploration  Education Rapport BuildingProblem-Solving Support  Summary of Progress/Problems:  The main focus of today's group was overcoming obstacles.  Patient advised he has to deal with his alcohol problem.  He as able to identify appropriate coping skills and the need to get involved with AA.   Wynn BankerHodnett, Joscelyne Renville Hairston 09/07/2013    3:29 PM

## 2013-09-07 NOTE — Progress Notes (Signed)
Pt attended NA group for the entire length of time

## 2013-09-07 NOTE — Progress Notes (Signed)
D:  Patient's self inventory sheet, patient has fair sleep, improving appetite, normal energy level, improving attention span.  Rated depression, hopeless 2, anxiety 2-3.  Denied withdrawals.  Denied SI.  Pain goal today 1, worst pain 1.  Plans to discharge to mom's house.  Needs financial assistance to purchase meds after discharge. A:  Medications administered per MD orders.  Emotional support and encouragement given patient. R:  Denied SI and HI.   Contracts for safety.  Denied A/V hallucinations.  Will continue to monitor patient for safety with 15 minute checks.   Safety maintained.

## 2013-09-07 NOTE — Progress Notes (Signed)
Pt observed in his room in bed awake.  Pt reports he is doing well this evening, and he anticipates being discharged on Tuesday.  He says he will have a ride tomorrow about noon.  He denies SI/HI/AV.  He voices no needs or concerns at this time. Pt states he will return home when he is discharge and says he is fine with this plan.  Pt makes his needs known to staff.  Support and encouragement offered.  Safety maintained with q15 minute checks.

## 2013-09-07 NOTE — Progress Notes (Signed)
Recreation Therapy Notes  Date: 02.02.2015 Time: 2:45pm Location: 500 Hall Dayroom   Group Topic: Wellness  Goal Area(s) Addresses:  Patient will define components of whole wellness. Patient will verbalize benefit of whole wellness.  Behavioral Response: Appropriate   Intervention: Mind Map  Activity: Patients were provided a worksheet with a bubble chart. As a group patients were asked to define the dimensions of wellness - Physical, Mental, Emotional, Social, Intellectual, Environmental, Leisure, and Spiritual. Patients were then asked to identify examples of ways they can invest in each dimension of wellness.   Education: Wellness, Building control surveyorDischarge Planning, Coping Skills   Education Outcome: Acknowledges understanding  Clinical Observations/Feedback: Patient actively engaged in group session, defining with peers dimensions of wellness. Patient additionally identified examples of dimensions of wellness with peers in group. Patient made no contributions to group session, but appeared to actively listen as he maintained appropriate eye contact with speaker.   Marykay Lexenise L Danial Hlavac, LRT/CTRS  Jearl KlinefelterBlanchfield, Monty Mccarrell L 09/07/2013 5:24 PM

## 2013-09-07 NOTE — Tx Team (Signed)
Interdisciplinary Treatment Plan Update (Adult)  Date: 09/07/2013  Time Reviewed:  9:45 AM  Progress in Treatment: Attending groups: Yes Participating in groups:  Yes Taking medication as prescribed:  Yes Tolerating medication:  Yes Family/Significant othe contact made: Yes, with Eugene Sanders's friend Patient understands diagnosis:  Yes Discussing patient identified problems/goals with staff:  Yes Medical problems stabilized or resolved:  Yes Denies suicidal/homicidal ideation: Yes Issues/concerns per patient self-inventory:  Yes Other:  New problem(s) identified: N/A  Discharge Plan or Barriers: CSW assessing for appropriate referrals.  Reason for Continuation of Hospitalization: Anxiety Depression Medication Stabilization  Comments: N/A  Estimated length of stay: 1 day, d/c tomorrow  For review of initial/current patient goals, please see plan of care.  Attendees: Patient:     Family:     Physician:  Dr. Javier GlazierJohnalagadda 09/07/2013 10:13 AM   Nursing:   Quintella ReichertBeverly Knight, RN 09/07/2013 10:13 AM   Clinical Social Worker:  Reyes Ivanhelsea Horton, LCSW 09/07/2013 10:13 AM   Other: Marzetta Boardhrista Dopson, RN 09/07/2013 10:13 AM   Other:  Tomasita Morrowelora Sutton, care coordination 09/07/2013 10:13 AM   Other:  Onnie BoerJennifer Clark, case manager 09/07/2013 10:13 AM   Other:  Nadean CorwinKim Maggio, RN 09/07/2013 10:16 AM   Other:    Other:    Other:    Other:    Other:    Other:     Scribe for Treatment Team:   Carmina MillerHorton, Sachi Boulay Nicole, 09/07/2013 10:13 AM

## 2013-09-07 NOTE — [facility] Group Notes (Signed)
[facility]   09/07/2013 8:45 AM  Participation Quality:  Alert, Appropriate and Oriented  Mood/Affect:  Calm  Depression Rating:  1  Anxiety Rating:  2  Thoughts of Suicide:  Pt denies SI/HI  Will you contract for safety?   Yes  Current AVH:  Pt denies  Plan for Discharge/Comments:  Pt attended discharge planning group and actively participated in group.  CSW provided pt with today's workbook.  Pt reports feeling well today with a plan to focus on himself, get a job and go back to school.  Pt will return [place] mother[place] home [place] and denies having any follow up.  CSW to assess appropriate referrals.  No further needs voiced by pt at this time.    Transportation Means: Pt reports access to transportation - mom will pick pt up  Supports: No supports mentioned at this time  Eugene IvanChelsea Horton, LCSW 09/07/2013 9:59 AM

## 2013-09-08 MED ORDER — TRAZODONE HCL 100 MG PO TABS: 100.0000 mg | ORAL_TABLET | Freq: Every evening | ORAL | Status: DC | PRN

## 2013-09-08 MED ORDER — TRAZODONE HCL 100 MG PO TABS
100.0000 mg | ORAL_TABLET | Freq: Every evening | ORAL | Status: DC | PRN
  Filled 2013-09-08: qty 14

## 2013-09-08 MED ORDER — BUPROPION HCL ER (SR) 150 MG PO TB12: 150.0000 mg | ORAL_TABLET | Freq: Every day | ORAL | Status: DC

## 2013-09-08 NOTE — Progress Notes (Signed)
The focus of this group is to educate the patient on the purpose and policies of crisis stabilization and provide a format to answer questions about their admission.  The group details unit policies and expectations of patients while admitted.  Patient attended 0900 nurse education orientation group this morning.  Patient actively participated, appropriate affect, alert, appropriate insight and engagement.  Today patient will work on 3 goals for discharge.  

## 2013-09-08 NOTE — Discharge Summary (Signed)
Physician Discharge Summary Note  Patient:  Eugene Sanders is an 23 y.o., male MRN:  [id number] DOB:  1990/10/09 Patient phone:  [phone] (home)  Patient address:   [address] [place] [address],  Total Time spent with patient: Greater than 25 minutes  Date of Admission:  09/02/2013 Date of Discharge: 09/08/2013  Reason for Admission:  Depression / Suicidal Ideation  Discharge Diagnoses: Active Problems:   Suicide attempt by substance overdose   MDD (major depressive disorder)   Cannabis abuse   Psychiatric Specialty Exam: Physical Exam Full Physical Exam performed in ED; reviewed, stable, and I concur with this assessment.   Review of Systems  Constitutional: Negative.   HENT: Negative.   Eyes: Negative.   Respiratory: Negative.   Cardiovascular: Negative.   Gastrointestinal: Negative.   Genitourinary: Negative.   Musculoskeletal: Negative.   Skin: Negative.   Neurological: Negative.   Endo/Heme/Allergies: Negative.   Psychiatric/Behavioral: Positive for depression (1/10). Negative for suicidal ideas, hallucinations, memory loss and substance abuse. The patient is nervous/anxious (anxiety 2-3/10). The patient does not have insomnia.     Blood pressure 134/75, pulse 98, temperature 97.8 F (36.6 C), temperature source Oral, resp. rate 16, height 5' 8.25" (1.734 m), weight 72.122 kg (159 lb), SpO2 98.00%.Body mass index is 23.99 kg/(m^2).  General Appearance: Casual  Eye Contact::  Good  Speech:  Clear/Coherent but pt does mumble at times, seems to be baseline.  Volume:  Normal  Mood:  Euthymic  Affect:  Appropriate  Thought Process:  Coherent  Orientation:  Full (Time, Place, and Person)  Thought Content:  WDL  Suicidal Thoughts:  No  Homicidal Thoughts:  No  Memory:  Immediate;   Good Recent;   Good Remote;   Good  Judgement:  Fair  Insight:  Fair  Psychomotor Activity:  Normal  Concentration:  Good  Recall:  Good  Fund of Knowledge:Good   Language: Good  Akathisia:  NA  Handed:  Right  AIMS (if indicated):     Assets:  Communication Skills Desire for Improvement Resilience  Sleep:  Number of Hours: 6.75    Past Psychiatric History: Diagnosis: MDD, Suicidal Ideation  Hospitalizations: Current   Outpatient Care: Limited historical (cannot recall)  Substance Abuse Care: Denies  Self-Mutilation: Denies  Suicidal Attempts: x1 (this admission OD Coricidin)  Violent Behaviors: Denies   Musculoskeletal: Strength & Muscle Tone: within normal limits Gait & Station: normal Patient leans: N/A  DSM5: Substance/Addictive Disorders:  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 Asthma   . Depression   . Anxiety    AXIS IV:  other psychosocial or environmental problems and problems related to social environment AXIS V:  61-70 mild symptoms  Level of Care:  OP  Hospital Course:   Eugene Sanders is a 23 y.o. single African American male admitted voluntarily in emergently from [facility] for the symptoms of Depression/Social Anxiety and suicidal attempt. Patient stated that he felt overwhelmed with life, states that a male friend that he liked did not want to take the friendship any further and he was hurt by her rejection and ingested 16-20 Coricidin tabs with the intention to end his life. This is  patient's 1st suicide attempt and says he has been depressed and has SI thoughts all the time. Patient stated that he has been depressed all his life but never received medication management, has limited outpatient therapies. Patient complains of having social anxiety and trouble talking  to people and doesn't like being alone.He lives with his mother and brother and says they are not close and are not supportive. He uses alcohol and marijuana--drinks 1-2 40oz bottles of alcohol weekly and 1 blunt (marijuana) weekly. Patient is a Consulting civil engineer at [employer] and feels low self-esteem and unable to make friendship. Patient wants help with his anxiety depression and substance abuse.  During Hospitalization: Medications managed, psychoeducation, group and individual therapy. Pt currently denies SI, HI, and Psychosis. At discharge, pt rates anxiety at 2-3/10 and depression at 1/10. Pt affirms increased knowledge and implementation of effective coping strategies for depression and anxiety including: "playing sports, being more active, being around people, getting out more" and states that he will greatly benefit from "talking to a therapist because I normally keep all my business to myself and I think that would be someone I could trust and open up to to share my business with so I don't let it build up and affect me". Pt states that he does not have a good supportive home environment at this time, but will followup with outpatient treatment (referral from treatment team). Affirms agreement with medication regimen and discharge plan. Denies other physical and psychological concerns at time of discharge.   Consults:  None  Significant Diagnostic Studies:  None  Discharge Vitals:   Blood pressure 134/75, pulse 98, temperature 97.8 F (36.6 C), temperature source Oral, resp. rate 16, height 5' 8.25" (1.734 m), weight 72.122 kg (159 lb), SpO2 98.00%. Body mass index is 23.99 kg/(m^2). Lab Results:   No results found for this or any previous visit (from the past 72 hour(s)).  Physical Findings: AIMS: Facial and Oral Movements Muscles of Facial Expression: None, normal Lips and Perioral Area: None, normal Jaw: None, normal Tongue: None, normal,Extremity Movements Upper (arms, wrists, hands, fingers):  None, normal Lower (legs, knees, ankles, toes): None, normal, Trunk Movements Neck, shoulders, hips: None, normal, Overall Severity Severity of abnormal movements (highest score from questions above): None, normal Incapacitation due to abnormal movements: None, normal Patient's awareness of abnormal movements (rate only patient's report): No Awareness, Dental Status Current problems with teeth and/or dentures?: No Does patient usually wear dentures?: No  CIWA:  CIWA-Ar Total: 1 COWS:  COWS Total Score: 2  Psychiatric Specialty Exam: See Psychiatric Specialty Exam and Suicide Risk Assessment completed by Attending Physician prior to discharge.  Discharge destination:  Home  Is patient on multiple antipsychotic therapies at discharge:  No   Has Patient had three or more failed trials of antipsychotic monotherapy by history:  No  Recommended Plan for Multiple Antipsychotic Therapies: NA     Medication List       Indication   buPROPion 150 MG 12 hr tablet  Commonly known as:  WELLBUTRIN SR  Take 1 tablet (150 mg total) by mouth daily.   Indication:  mood stabilization     traZODone 100 MG tablet  Commonly known as:  DESYREL  Take 1 tablet (100 mg total) by mouth at bedtime as needed for sleep.   Indication:  Trouble Sleeping           Follow-up Information   Follow up with [facility] On 09/09/2013. (Please go to [facility] walk in clinic  on Wednesday, September 09, 2013 or any weekday betwen 8AM-12 Noon or 1PM-3PM for medication management and counseling )    Contact information:   [address] [place], [place]   [id number]  [phone]      Follow-up recommendations:  Activity:  As tolerated Diet:  Heart healthy with low sodium. Other:  Follow-up with outpatient therapist for counseling and pharmacologic management.  Comments:   Take all medications as prescribed. Keep all follow-up appointments as scheduled.  Do not consume alcohol or use illegal  drugs while on prescription medications. Report any adverse effects from your medications to your primary care provider promptly.  In the event of recurrent symptoms or worsening symptoms, call 911, a crisis hotline, or go to the nearest emergency department for evaluation.   Total Discharge Time:  Greater than 30 minutes.  Signed: Beau FannyWithrow, John C, FNP-BC 09/08/2013, 10:13 AM  Patient was seen face-to-face for reevaluation, discharged suicide risk assessment, case discussed with treatment team and a physician extender and made disposition plan. Reviewed the information documented and agree with the treatment plan.  Kwadwo Taras,JANARDHAHA R. 09/08/2013 11:17 AM

## 2013-09-08 NOTE — Progress Notes (Signed)
D:  Patient's self inventory sheet, patient sleeps well, improving appetite, low energy level, improving attention span.  Rated depression and hopeless 1, anxiety 3.  Denied withdrawals.  Denied SI.  Denied physical problems.  Plans to discharge home after discharge.  Needs financial assistance for medications. A:  Medications administered per MD orders.  Emotional support and encouragement given patient. R:  Denied SI and HI, contracts for safety.  Denied A/V hallucinations.  Will continue to monitor patient for safety with 15 minute checks.  Safety maintained.

## 2013-09-08 NOTE — Progress Notes (Signed)
[facility] :  Will you be returning to the same living situation after discharge: Yes,  Patient is returning to his home. At discharge, do you have transportation home?:Yes,  Patient to arrange transportation. Do you have the ability to pay for your medications:No.  Patient needs assistance with indigent medications   Release of information consent forms completed and in the chart;  Patient's signature needed at discharge.  Patient to Follow up at: Follow-up Information   [facility] ([facility] on Wednesday, September 09, 2013 or any weekday betwen 8AM-12 Noon or 1PM-3PM for medication management and counseling )    Contact information:   [address] [place], [place]   [address]  [phone]      Patient denies SI/HI: Patient no longer endorsing SI/HI or other thoughts of self harm.   Safety Planning and Suicide Prevention discussed: .Reviewed with all patients during discharge planning group   Eugene Sanders, Eugene Sanders 09/08/2013, 9:45 AM

## 2013-09-08 NOTE — Progress Notes (Signed)
Recreation Therapy Notes  Animal-Assisted Activity/Therapy (AAA/T) Program Checklist/Progress Notes Patient Eligibility Criteria Checklist & Daily Group note for Rec Tx Intervention  Date: 02.03.2015 Time: 2:45pm [address]   AAA/T Program Assumption of Risk Form signed by Patient/ or Parent Legal Guardian yes  Patient is free of allergies or sever asthma yes  Patient reports no fear of animals yes  Patient reports no history of cruelty to animals yes   Patient understands his/her participation is voluntary yes  Patient washes hands before animal contact yes  Patient washes hands after animal contact yes  Behavioral Response: Appropriate   Education: Hand Washing, Appropriate Animal Interaction   Education Outcome: Acknowledges understanding  Clinical Observations/Feedback: Patient declined on admission, but stated he would like to sit in the room. LRT honored patient desires. Patient remained in session for approximately 10 minutes before exiting.   Marykay Lexenise L Anacristina Steffek, LRT/CTRS  Alani Lacivita L 09/08/2013 3:06 PM

## 2013-09-08 NOTE — Progress Notes (Signed)
Discharge Note:  Patient discharged home with family member.  Patient denied SI and HI.  Denied A/V hallucinations.  Denied pain.  Suicide prevention information given and discussed with patient who stated he understood and had no questions.  Patient received all his belongings, shoe strings, clothing, jacket, prescriptions, medications,  misc items, toiletries.  Patient stated he appreciated all assistance received from [facility].

## 2013-09-08 NOTE — [facility] Suicide Risk Assessment (Signed)
Demographic Factors:  Male, Adolescent or young adult, Low socioeconomic status and Unemployed  Total Time spent with patient: 30 minutes  Psychiatric Specialty Exam: Physical Exam  Nursing note and vitals reviewed. Constitutional: He is oriented to person, place, and time. He appears well-developed and well-nourished.  HENT:  Head: Normocephalic.  Eyes: Pupils are equal, round, and reactive to light.  Neck: Normal range of motion.  Cardiovascular: Normal rate.   Respiratory: Effort normal.  GI: Soft.  Musculoskeletal: Normal range of motion.  Neurological: He is alert and oriented to person, place, and time.  Skin: Skin is warm.    Review of Systems  All other systems reviewed and are negative.    Blood pressure 134/75, pulse 98, temperature 97.8 F (36.6 C), temperature source Oral, resp. rate 16, height 5' 8.25" (1.734 m), weight 72.122 kg (159 lb), SpO2 98.00%.Body mass index is 23.99 kg/(m^2).  General Appearance: Casual  Eye Contact::  Good  Speech:  Clear and Coherent  Volume:  Normal  Mood:  Anxious and Depressed  Affect:  Depressed and Flat  Thought Process:  Goal Directed and Intact  Orientation:  Full (Time, Place, and Person)  Thought Content:  WDL  Suicidal Thoughts:  No  Homicidal Thoughts:  No  Memory:  Immediate;   Fair  Judgement:  Fair  Insight:  Fair  Psychomotor Activity:  Normal  Concentration:  Good  Recall:  [employer]  Fund of Knowledge:Good  Language: Fair  Akathisia:  NA  Handed:  Right  AIMS (if indicated):     Assets:  Communication Skills Desire for Improvement Financial Resources/Insurance Housing Physical Health Resilience Social Support Transportation  Sleep:  Number of Hours: 6.75    Musculoskeletal: Strength & Muscle Tone: within normal limits Gait & Station: normal Patient leans: N/A   Mental Status Per Nursing Assessment::   On Admission:  Suicidal ideation indicated by patient;Self-harm thoughts  Current Mental  Status by Physician: Mental Status Examination: Patient appeared as per his stated age, casually dressed, and fairly groomed, and maintaining good eye contact. Patient has good mood and his affect was constricted. He has normal rate, rhythm, and volume of speech. His thought process is linear and goal directed. Patient has denied suicidal, homicidal ideations, intentions or plans. Patient has no evidence of auditory or visual hallucinations, delusions, and paranoia. Patient has fair insight judgment and impulse control.  Loss Factors: Financial problems/change in socioeconomic status  Historical Factors: Family history of mental illness or substance abuse and Impulsivity  Risk Reduction Factors:   Sense of responsibility to family, Religious beliefs about death, Positive social support, Positive therapeutic relationship and Positive coping skills or problem solving skills  Continued Clinical Symptoms:  Depression:   Comorbid alcohol abuse/dependence Recent sense of peace/wellbeing Alcohol/Substance Abuse/Dependencies Previous Psychiatric Diagnoses and Treatments  Cognitive Features That Contribute To Risk:  Polarized thinking    Suicide Risk:  Minimal: No identifiable suicidal ideation.  Patients presenting with no risk factors but with morbid ruminations; may be classified as minimal risk based on the severity of the depressive symptoms  Discharge Diagnoses:   AXIS I:  Major Depression, Recurrent severe and Substance Abuse AXIS II:  Deferred AXIS III:   Past Medical History  Diagnosis Date  . Asthma   . Depression   . Anxiety    AXIS IV:  economic problems, occupational problems, other psychosocial or environmental problems, problems related to social environment and problems with primary support group AXIS V:  61-70 mild symptoms  Plan  Of Care/Follow-up recommendations:  Activity:  As tolerated Diet:  Regular  Is patient on multiple antipsychotic therapies at discharge:   No   Has Patient had three or more failed trials of antipsychotic monotherapy by history:  No  Recommended Plan for Multiple Antipsychotic Therapies: NA    Eugene Sanders,JANARDHAHA R. 09/08/2013, 11:18 AM

## 2013-09-11 NOTE — Progress Notes (Signed)
Patient Discharge Instructions:  After Visit Summary (AVS):   Faxed to:  09/11/13 Discharge Summary Note:   Faxed to:  09/11/13 Psychiatric Admission Assessment Note:   Faxed to:  09/11/13 Suicide Risk Assessment - Discharge Assessment:   Faxed to:  09/11/13 Faxed/Sent to the Next Level Care provider:  09/11/13 [facility] to [facility] @ [phone]  Jerelene ReddenSheena E [place], 09/11/2013, 4:19 PM

## 2019-08-31 ENCOUNTER — Emergency Department (HOSPITAL_COMMUNITY): Payer: Managed Care, Other (non HMO)

## 2019-08-31 ENCOUNTER — Encounter (HOSPITAL_COMMUNITY): Payer: Self-pay | Admitting: Emergency Medicine

## 2019-08-31 ENCOUNTER — Observation Stay (HOSPITAL_COMMUNITY)
Admission: EM | Admit: 2019-08-31 | Discharge: 2019-09-01 | Disposition: A | Discharge status: Home / Self Care | Payer: Managed Care, Other (non HMO) | Attending: Student in an Organized Health Care Education/Training Program | Admitting: Student in an Organized Health Care Education/Training Program

## 2019-08-31 ENCOUNTER — Other Ambulatory Visit: Payer: Self-pay

## 2019-08-31 DIAGNOSIS — I10 Essential (primary) hypertension: Secondary | ICD-10-CM | POA: Diagnosis not present

## 2019-08-31 DIAGNOSIS — F329 Major depressive disorder, single episode, unspecified: Secondary | ICD-10-CM | POA: Insufficient documentation

## 2019-08-31 DIAGNOSIS — Z20822 Contact with and (suspected) exposure to covid-19: Secondary | ICD-10-CM | POA: Insufficient documentation

## 2019-08-31 DIAGNOSIS — G253 Myoclonus: Principal | ICD-10-CM | POA: Diagnosis present

## 2019-08-31 DIAGNOSIS — R519 Headache, unspecified: Secondary | ICD-10-CM | POA: Diagnosis not present

## 2019-08-31 DIAGNOSIS — F419 Anxiety disorder, unspecified: Secondary | ICD-10-CM | POA: Diagnosis not present

## 2019-08-31 DIAGNOSIS — J45909 Unspecified asthma, uncomplicated: Secondary | ICD-10-CM | POA: Insufficient documentation

## 2019-08-31 DIAGNOSIS — Z79899 Other long term (current) drug therapy: Secondary | ICD-10-CM | POA: Insufficient documentation

## 2019-08-31 LAB — BASIC METABOLIC PANEL
Anion gap: 11 (ref 5–15)
BUN: 17 mg/dL (ref 6–20)
CO2: 26 mmol/L (ref 22–32)
Calcium: 9.5 mg/dL (ref 8.9–10.3)
Chloride: 101 mmol/L (ref 98–111)
Creatinine, Ser: 1.18 mg/dL (ref 0.61–1.24)
GFR calc Af Amer: >60 mL/min (ref >60)
GFR calc non Af Amer: >60 mL/min (ref >60)
Glucose, Bld: 90 mg/dL (ref 70–99)
Potassium: 4 mmol/L (ref 3.5–5.1)
Sodium: 138 mmol/L (ref 135–145)

## 2019-08-31 LAB — CBC
HCT: 46.6 % (ref 39.0–52.0)
Hemoglobin: 15.3 g/dL (ref 13.0–17.0)
MCH: 29.4 pg (ref 26.0–34.0)
MCHC: 32.8 g/dL (ref 30.0–36.0)
MCV: 89.6 fL (ref 80.0–100.0)
Platelets: 310 10*3/uL (ref 150–400)
RBC: 5.2 MIL/uL (ref 4.22–5.81)
RDW: 11.9 % (ref 11.5–15.5)
WBC: 6 10*3/uL (ref 4.0–10.5)
nRBC: 0 % (ref 0.0–0.2)

## 2019-08-31 LAB — TROPONIN I (HIGH SENSITIVITY): Troponin I (High Sensitivity): 3 ng/L (ref <18)

## 2019-08-31 IMAGING — CR DG CHEST 2V
2 series · 2 of 2 positions shown · non-contrast
Comparison: None.

CLINICAL DATA: Chest pain

EXAM:
CHEST - 2 VIEW

[chest pa]
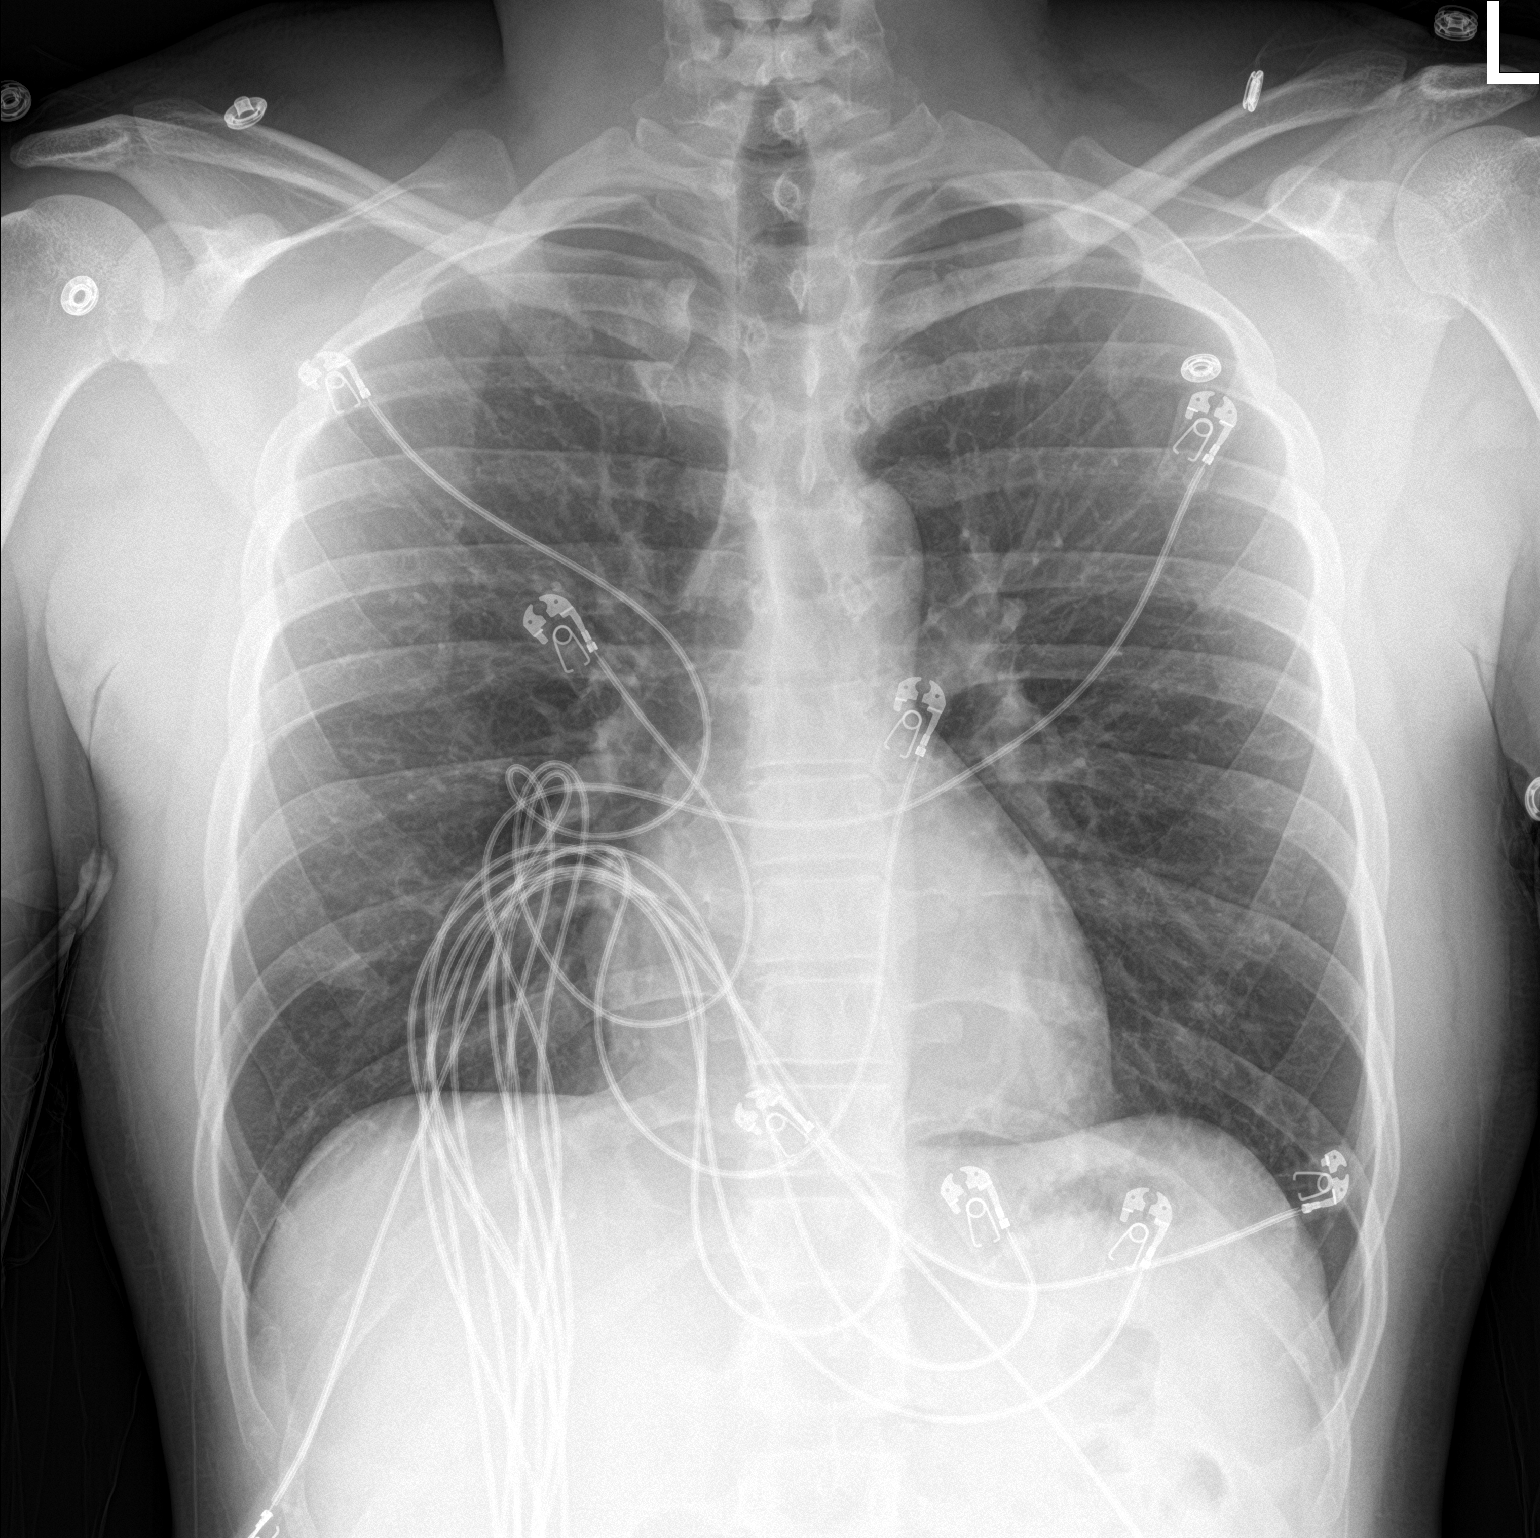

[chest lat]
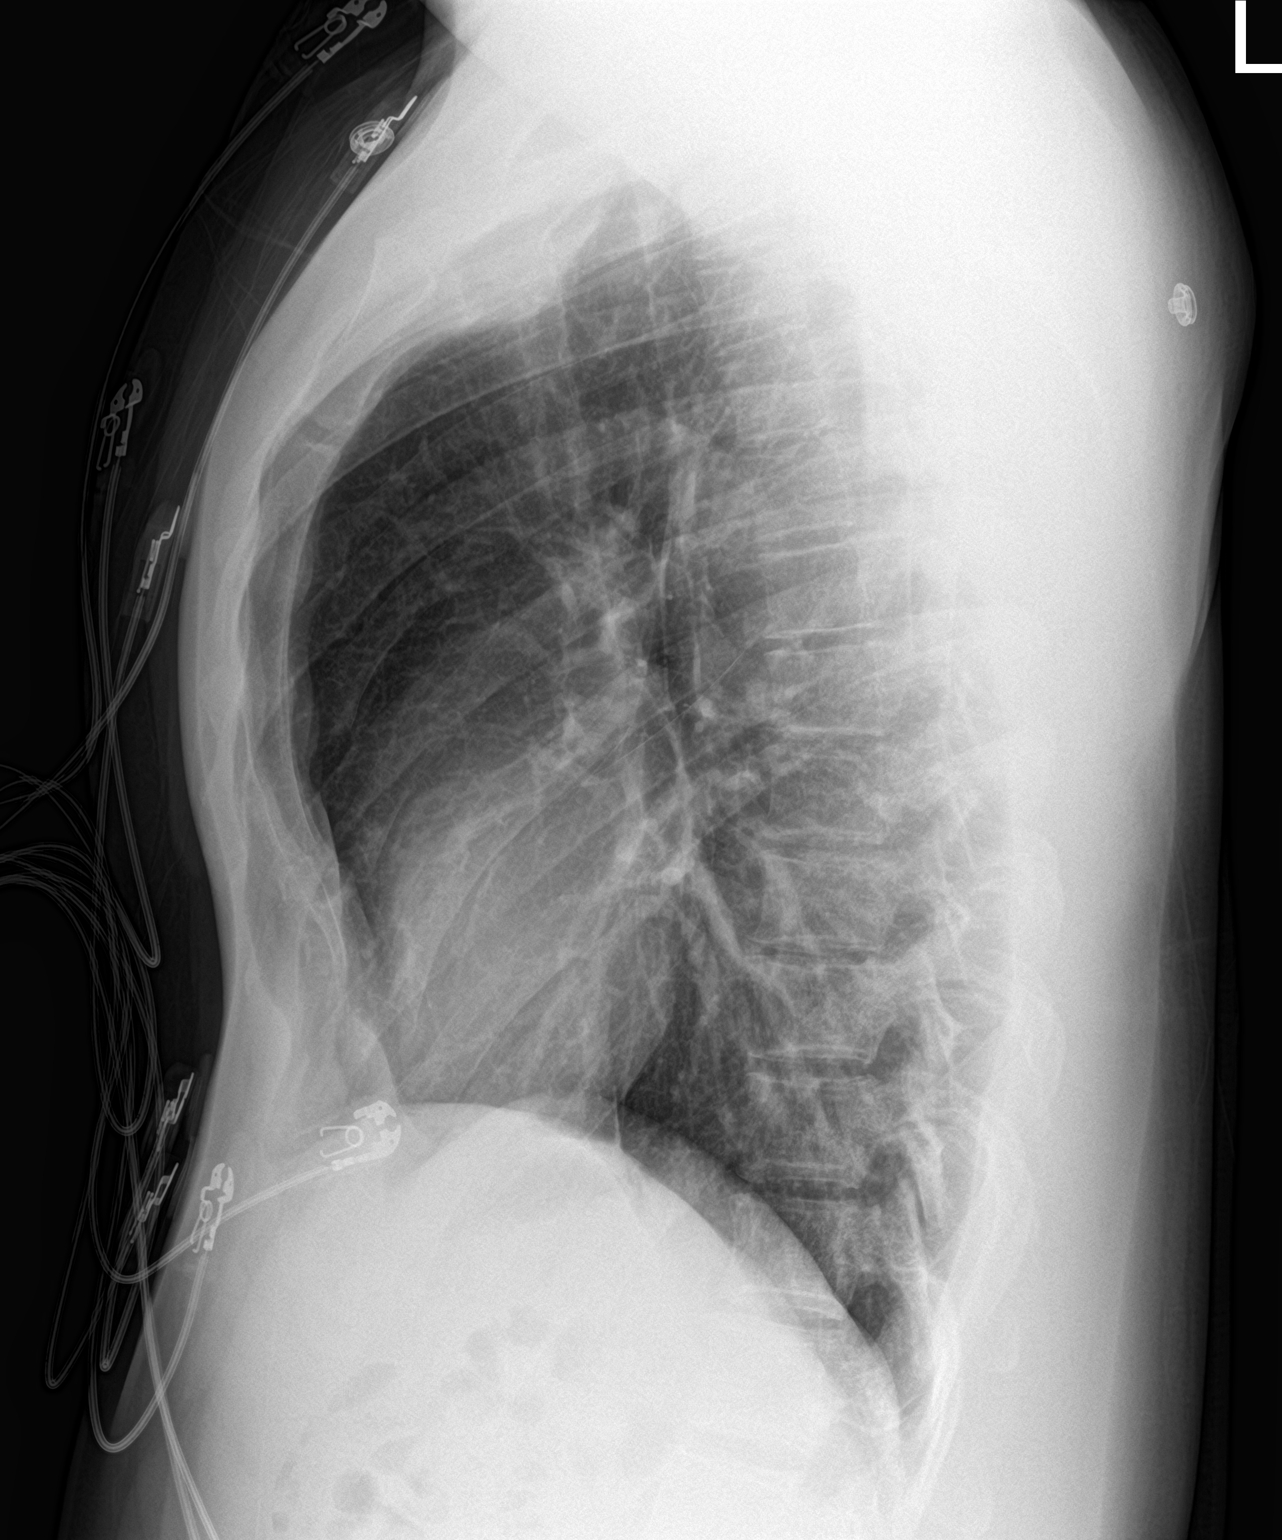

[2 of 2 positions shown; findings below may reference images not displayed]

FINDINGS: The heart size and mediastinal contours are within normal limits.
Both lungs are clear. The visualized skeletal structures are
unremarkable.
IMPRESSION: Normal study.

## 2019-08-31 MED ORDER — SODIUM CHLORIDE 0.9% FLUSH
3.0000 mL | Freq: Once | INTRAVENOUS | Status: AC
  Administered 2019-08-31: 3 mL via INTRAVENOUS

## 2019-08-31 NOTE — ED Triage Notes (Signed)
Pt reports intermittent headaches, nosebleeds, occasional chest pain, and jerking that wakes him from sleep. Denies chest pain today.

## 2019-08-31 NOTE — ED Notes (Signed)
PT is back from imaging.

## 2019-08-31 NOTE — ED Notes (Signed)
Pt transported to XRAY °

## 2019-08-31 NOTE — ED Provider Notes (Signed)
[facility] EMERGENCY DEPARTMENT Provider Note   CSN: [id number] Arrival date & time: 08/31/19  2040     History Chief Complaint  Patient presents with  . Headache  . Epistaxis    Kino Dunsworth is a 29 y.o. male with a history of hypertension and anxiety who presents to the emergency department with a chief complaint of headache.  The patient endorses a left frontal headache that has been constant for the last 3 days since onset.  He reports that the pain significantly worsened today.  He characterizes the headache as throbbing.  He denies any treatment prior to arrival.  He also notes that he had jerking of his bilateral arms and legs only at night that began on 01/21, 5 days ago.  No history of similar.  No history of seizures.  He was not able to sleep for 2 nights due to the jerking. After the initial 2 nights, he began taking Benadryl, and has had no further episodes of extremity jerking.  He also notes that he has had no jerking during the day.  No history of restless leg syndrome.  He reports sporadic numbness in his arms and legs that also began around the same time.  He denies any numbness at this time, but noted that his right calf was numb earlier today.   He blew his nose earlier today and noticed some blood on the tissue from his left nare.  Otherwise, no epistaxis.  He does report that he was having some sharp, stabbing pain to his chest that began about a week ago that resolved over the last few days.  No associated shortness of breath, cough, fever, chills, loss of sense of taste or smell.  He does note that his supervisor at work was diagnosed with COVID-19 during the first week of January.  He would like to be tested for COVID-19.  He denies weakness, slurred speech, visual changes, neck or back pain, difficulty ambulating, abdominal pain, nausea, vomiting, diarrhea, or syncope.  He reports that he has been taking amlodipine sporadically since June.   Reports that he resumed the medication about 2 to 3 weeks ago.  He also reports that he took a dose of Zoloft on 1/22 and 1/23 that he was prescribed for anxiety, but did not like the way that the medication made him feel so he discontinued it.  Reports that he did take 1 dose of melatonin earlier this week.  No other over-the-counter medications, supplements, or other prescribed medication changes recently.  No alcohol use for the last week.  He is adamant that he has had no illicit or recreational substance use including cocaine or methamphetamine.  Reports that he no longer smokes cannabis.  The history is provided by the patient. No language interpreter was used.  Epistaxis Associated symptoms: headaches   Associated symptoms: no congestion, no dizziness, no fever, no sinus pain and no sore throat        Past Medical History:  Diagnosis Date  . Anxiety   . Asthma   . Depression     Patient Active Problem List   Diagnosis Date Noted  . 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 MDD (major depressive disorder) 09/02/2013  . Cannabis abuse 09/02/2013  . Suicide attempt by substance overdose ([place]) 09/02/2013    History reviewed. No pertinent surgical history.     No family history on file.  Social History   Tobacco Use  . Smoking status: Passive Smoke Exposure - Never Smoker  .  Smokeless tobacco: Never Used  Substance Use Topics  . Alcohol use: Yes    Comment: rare  . Drug use: Yes    Types: Marijuana    Home Medications Prior to Admission medications   Medication Sig Start Date End Date Taking? Authorizing Provider  amLODipine (NORVASC) 5 MG tablet Take 5 mg by mouth daily. 08/28/19  Yes [provider]  buPROPion (WELLBUTRIN SR) 150 MG 12 hr tablet Take 1 tablet (150 mg total) by mouth daily. Patient not taking: Reported on 09/01/2019 09/08/13   Beau Fanny, FNP  traZODone (DESYREL) 100 MG tablet Take 1 tablet (100 mg total) by mouth at bedtime as needed for sleep. Patient  not taking: Reported on 09/01/2019 09/08/13   Beau Fanny, FNP    Allergies    Patient has no known allergies.  Review of Systems   Review of Systems  Constitutional: Negative for appetite change, chills, diaphoresis and fever.  HENT: Positive for nosebleeds. Negative for congestion, ear pain, sinus pressure, sinus pain, sore throat and voice change.   Respiratory: Negative for shortness of breath and wheezing.   Cardiovascular: Negative for chest pain and palpitations.  Gastrointestinal: Negative for abdominal pain, diarrhea, nausea and vomiting.  Genitourinary: Negative for dysuria.  Musculoskeletal: Negative for back pain.  Skin: Negative for rash.  Allergic/Immunologic: Negative for immunocompromised state.  Neurological: Positive for tremors, numbness and headaches. Negative for dizziness, seizures, syncope, facial asymmetry, weakness and light-headedness.       Myoclonic jerking  Psychiatric/Behavioral: Negative for confusion.   Physical Exam Updated Vital Signs BP 129/87   Pulse 89   Temp 97.9 F (36.6 C) (Oral)   Resp 16   SpO2 99%   Physical Exam Vitals and nursing note reviewed.  Constitutional:      Appearance: He is well-developed.     Comments: Anxious appearing. Nontoxic.  HENT:     Head: Normocephalic.     Right Ear: Tympanic membrane normal.     Left Ear: Tympanic membrane normal.     Nose: Nose normal.     Comments: No epistaxis. Eyes:     Extraocular Movements: Extraocular movements intact.     Conjunctiva/sclera: Conjunctivae normal.     Pupils: Pupils are equal, round, and reactive to light.  Cardiovascular:     Rate and Rhythm: Regular rhythm. Tachycardia present.     Pulses: Normal pulses.     Heart sounds: Normal heart sounds. No murmur. No friction rub. No gallop.   Pulmonary:     Effort: Pulmonary effort is normal. No respiratory distress.     Breath sounds: No stridor. No wheezing, rhonchi or rales.     Comments: Lungs are clear to  auscultation bilaterally.  No increased work of breathing. Chest:     Chest wall: No tenderness.  Abdominal:     General: There is no distension.     Palpations: Abdomen is soft. There is no mass.     Tenderness: There is no abdominal tenderness. There is no right CVA tenderness, left CVA tenderness, guarding or rebound.     Hernia: No hernia is present.  Musculoskeletal:     Cervical back: Neck supple.  Skin:    General: Skin is warm and dry.  Neurological:     Mental Status: He is alert.     Comments: Mental Status: Patient is awake, alert, oriented to person, place, month, year, and situation. Patient is able to give a clear and coherent history. Cranial Nerves: II: Visual  Fields are full. Pupils are equal, round, and reactive to light. III,IV, VI: EOMI without ptosis or diploplia.  V: Facial sensation is symmetric to temperature VII: Facial movement is symmetric.  VIII: hearing is intact to voice X: Uvula elevates symmetrically XI: Shoulder shrug is symmetric. XII: tongue is midline without atrophy or fasciculations.  Motor: Tone is normal. Bulk is normal. 5/5 strength was present in all four extremities.  Sensory: Sensation is symmetric to light touch and temperature in the arms and legs. Cerebellar: FNF intact bilaterally 5 beats of clonus on the right.  No clonus noted on the left.  No cogwheeling of the bilateral upper extremities. Gait is not ataxic.  Psychiatric:        Behavior: Behavior normal.     ED Results / Procedures / Treatments   Labs (all labs ordered are listed, but only abnormal results are displayed) Labs Reviewed  SARS CORONAVIRUS 2 (TAT 6-24 HRS)  BASIC METABOLIC PANEL  CBC  CK  RAPID URINE DRUG SCREEN, HOSP PERFORMED  TROPONIN I (HIGH SENSITIVITY)  TROPONIN I (HIGH SENSITIVITY)    EKG EKG Interpretation  Date/Time:  Monday August 31 2019 22:16:51 EST Ventricular Rate:  101 PR Interval:    QRS Duration: 100 QT Interval:  333 QTC  Calculation: 432 R Axis:   84 Text Interpretation: Sinus tachycardia RSR' in V1 or V2, right VCD or RVH No significant change since last tracing Confirmed by Gwyneth Sprout ([id number]) on 08/31/2019 10:25:24 PM   Radiology DG Chest 2 View  Result Date: 08/31/2019 CLINICAL DATA:  Chest pain EXAM: CHEST - 2 VIEW COMPARISON:  None. FINDINGS: The heart size and mediastinal contours are within normal limits. Both lungs are clear. The visualized skeletal structures are unremarkable. IMPRESSION: Normal study. Electronically Signed   By: Charlett Nose M.D.   On: 08/31/2019 22:43   MR Brain W and Wo Contrast  Result Date: 09/01/2019 CLINICAL DATA:  Initial evaluation for intermittent headaches, jerking. EXAM: MRI HEAD WITHOUT AND WITH CONTRAST TECHNIQUE: Multiplanar, multiecho pulse sequences of the brain and surrounding structures were obtained without and with intravenous contrast. CONTRAST:  60mL GADAVIST GADOBUTROL 1 MMOL/ML IV SOLN COMPARISON:  None available. FINDINGS: Brain: Cerebral volume within normal limits for patient age. No focal parenchymal signal abnormality identified. No abnormal foci of restricted diffusion to suggest acute or subacute ischemia. Gray-white matter differentiation well maintained. No encephalomalacia to suggest chronic infarction. No foci of susceptibility artifact to suggest acute or chronic intracranial hemorrhage. No mass lesion, midline shift or mass effect. No hydrocephalus. No extra-axial fluid collection. Major dural sinuses are grossly patent. Pituitary gland and suprasellar region are normal. Midline structures intact and normal. No abnormal enhancement. Vascular: Major intracranial vascular flow voids well maintained and normal in appearance. Skull and upper cervical spine: Craniocervical junction normal. Visualized upper cervical spine within normal limits. Bone marrow signal intensity normal. No scalp soft tissue abnormality. Sinuses/Orbits: Globes and orbital soft  tissues within normal limits. Mild scattered mucosal thickening noted throughout the paranasal sinuses. No significant sinus opacification or air-fluid level. Trace opacity noted within the right mastoid air cells, of doubtful significance. Inner ear structures normal. Other: None. IMPRESSION: Normal brain MRI.  No acute intracranial abnormality identified. Electronically Signed   By: Rise Mu M.D.   On: 09/01/2019 02:44    Procedures Procedures (including critical care time)  Medications Ordered in ED Medications  LORazepam (ATIVAN) injection 1 mg (1 mg Intravenous Given 09/01/19 0139)  sodium chloride flush (NS) 0.9 %  injection 3 mL (3 mLs Intravenous Given 08/31/19 2231)  prochlorperazine (COMPAZINE) injection 10 mg (10 mg Intravenous Given 09/01/19 0120)  diphenhydrAMINE (BENADRYL) injection 25 mg (25 mg Intravenous Given 09/01/19 0120)  sodium chloride 0.9 % bolus 1,000 mL (0 mLs Intravenous Stopped 09/01/19 0244)  gadobutrol (GADAVIST) 1 MMOL/ML injection 7 mL (7 mLs Intravenous Contrast Given 09/01/19 0222)    ED Course  I have reviewed the triage vital signs and the nursing notes.  Pertinent labs & imaging results that were available during my care of the patient were reviewed by me and considered in my medical decision making (see chart for details).    MDM Rules/Calculators/A&P                      29 year old male with a history of hypertension and anxiety presenting with headache, myoclonic jerking at night, and intermittent numbness over the last few days.  In the ER, patient is mildly tachycardic, but otherwise vitals are normal. On exam, the patient has a 5 beats of clonus on the right. None on the left. No other focal neurologic deficits.  Consulted neurology and Dr. Laurence Slate recommends MRI brain with and without contrast, CK level, and UDS. He will come evaluate the patient.  Labs are normal. CK is normal. UDS is negative. Will give IV fluid bolus, Compazine,  Benadryl and reassess.  MRI is unremarkable. On re-evaluation, headache has resolved.  Spoke with Dr. Laurence Slate who is concerned for hyperreflexia on the left patella and ankle with cross adductors. His differential includes juvenile myoclonic epilepsy, narcolepsy, spinal myoclonus, and spinal cord lesion/demyelinization. After shared decision-making conversation with the patient, he is agreeable to admission and Dr. Laurence Slate will admit. COVID-19 test has been ordered and is pending. The patient appears reasonably stabilized for admission considering the current resources, flow, and capabilities available in the ED at this time, and I doubt any other [facility] requiring further screening and/or treatment in the ED prior to admission.   Final Clinical Impression(s) / ED Diagnoses Final diagnoses:  Myoclonus  Frontal headache    Rx / DC Orders ED Discharge Orders    None       Barkley Boards, PA-C 09/01/19 [id number]    Shon Baton, MD 09/01/19 [phone]

## 2019-09-01 ENCOUNTER — Observation Stay (HOSPITAL_COMMUNITY): Payer: Managed Care, Other (non HMO)

## 2019-09-01 ENCOUNTER — Emergency Department (HOSPITAL_COMMUNITY): Payer: Managed Care, Other (non HMO)

## 2019-09-01 DIAGNOSIS — G253 Myoclonus: Secondary | ICD-10-CM | POA: Diagnosis present

## 2019-09-01 DIAGNOSIS — R519 Headache, unspecified: Secondary | ICD-10-CM

## 2019-09-01 LAB — RAPID URINE DRUG SCREEN, HOSP PERFORMED
Amphetamines: NOT DETECTED
Barbiturates: NOT DETECTED
Benzodiazepines: NOT DETECTED
Cocaine: NOT DETECTED
Opiates: NOT DETECTED
Tetrahydrocannabinol: NOT DETECTED

## 2019-09-01 LAB — SARS CORONAVIRUS 2 (TAT 6-24 HRS): SARS Coronavirus 2: NEGATIVE

## 2019-09-01 LAB — CK: Total CK: 238 U/L (ref 49–397)

## 2019-09-01 LAB — TSH: TSH: 4.619 u[IU]/mL — ABNORMAL HIGH (ref 0.350–4.500)

## 2019-09-01 LAB — HEMOGLOBIN A1C
Hgb A1c MFr Bld: 5.3 % (ref 4.8–5.6)
Mean Plasma Glucose: 105.41 mg/dL

## 2019-09-01 LAB — TROPONIN I (HIGH SENSITIVITY): Troponin I (High Sensitivity): 3 ng/L (ref <18)

## 2019-09-01 LAB — VITAMIN B12: Vitamin B-12: 243 pg/mL (ref 180–914)

## 2019-09-01 IMAGING — MR MR CERVICAL SPINE WO/W CM
11 of 25 series · 19 of 48 positions shown · IV contrast (gadavist)
Comparison: None.

CLINICAL DATA: Demyelinating disease. Headache and jerking
movements

EXAM:
MRI TOTAL SPINE WITHOUT AND WITH CONTRAST
TECHNIQUE: Multisequence MR imaging of the spine from the cervical spine to the
sacrum was performed prior to and following IV contrast
administration for evaluation of spinal metastatic disease.
CONTRAST:  10mL GADAVIST GADOBUTROL 1 MMOL/ML IV SOLN

[Series 2: T2 · sagittal · 3.0mm · 0.43mm/px · 2 of 16 slices shown (1 of 6)]
[im 1/16]
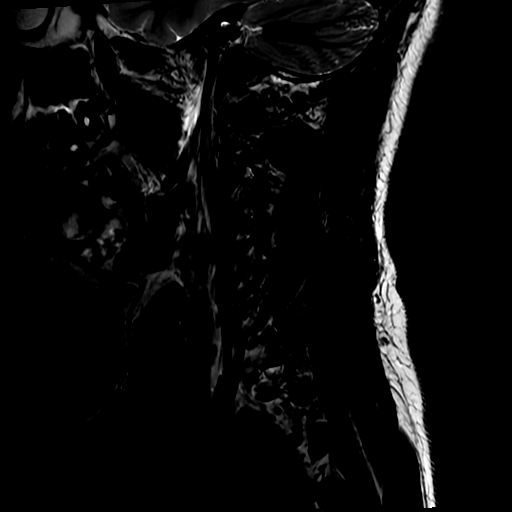
[im 16/16]
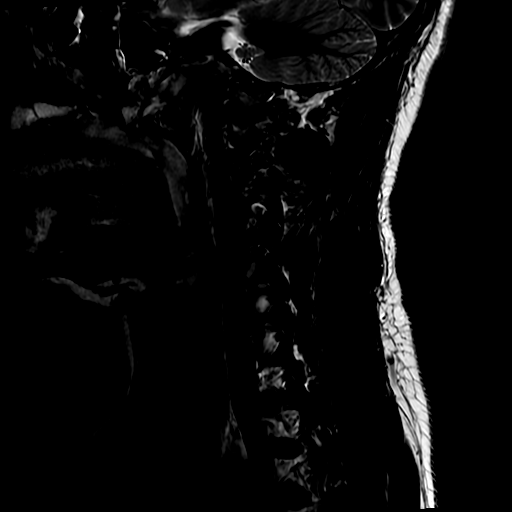

[Series 6: T2 · axial · 3.0mm · 0.35mm/px · z∈[-139,-10]mm · 2 of 39 slices shown (2 of 6)]
[im 1/39]
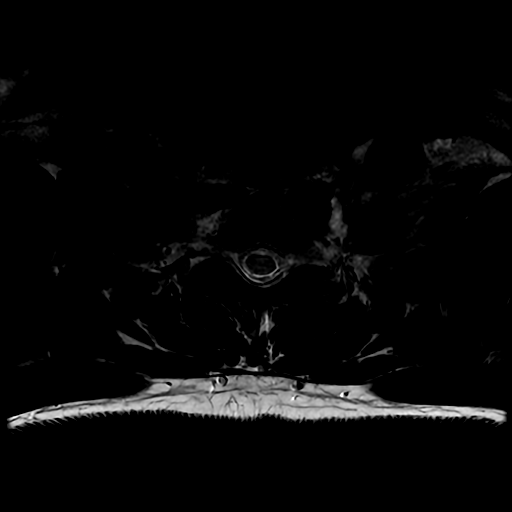
[im 39/39]
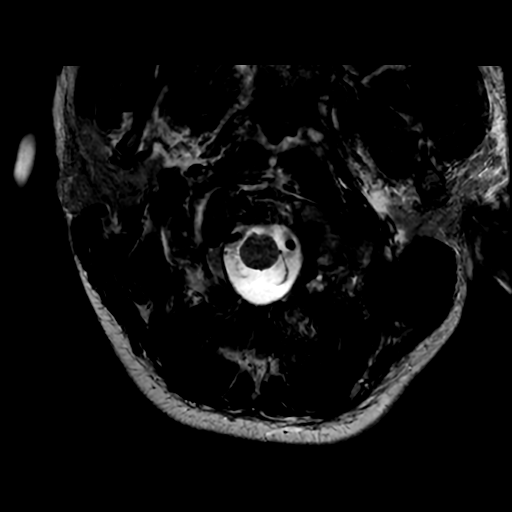

[Series 7: T1 · axial · non-contrast · 3.0mm · 0.35mm/px · z∈[-139,-10]mm · 2 of 40 slices shown (1 of 5)]
[im 1/40]
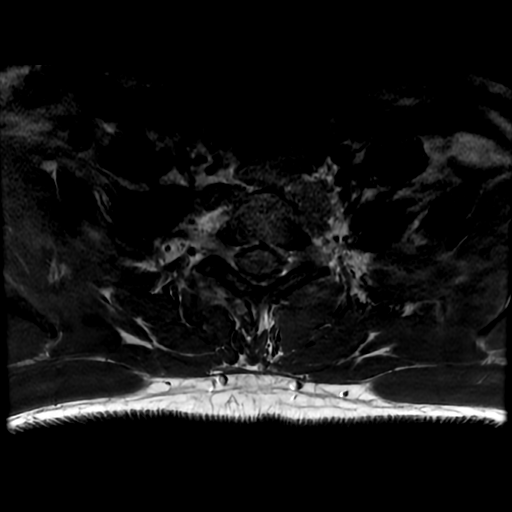
[im 40/40]
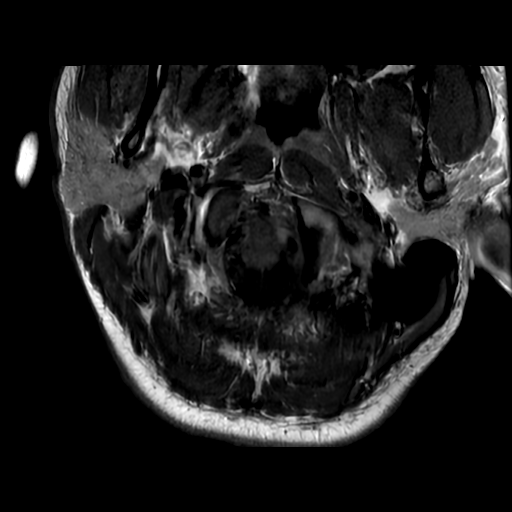

[Series 9: T1 · sagittal · 3.0mm · 0.90mm/px · 1 of 12 slices shown (2 of 5)]
[im 1/12]
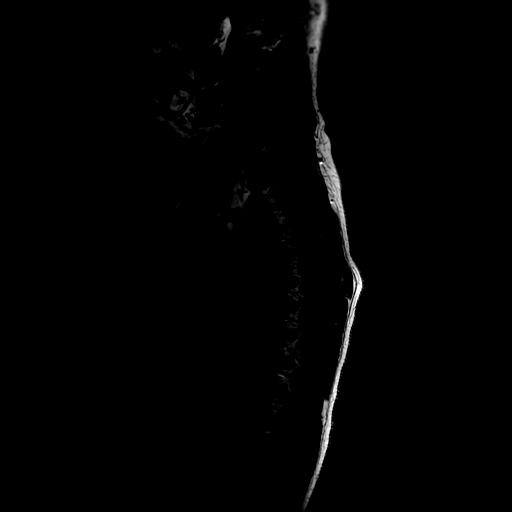

[Series 10: T2 · sagittal · 3.0mm · 0.66mm/px · 1 of 15 slices shown (3 of 6)]
[im 1/15]
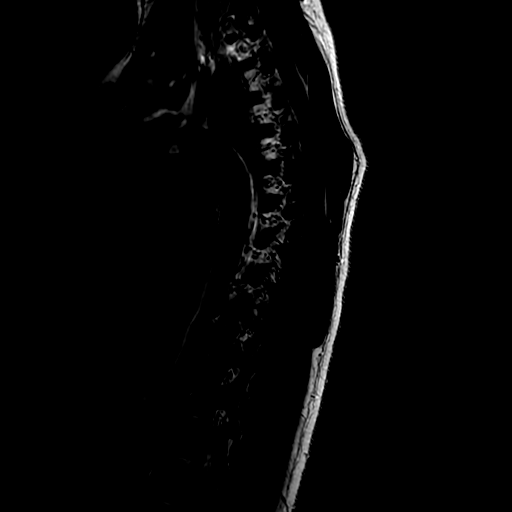

[Series 11: T1 · sagittal · 3.0mm · 0.66mm/px · 1 of 15 slices shown (3 of 5)]
[im 1/15]
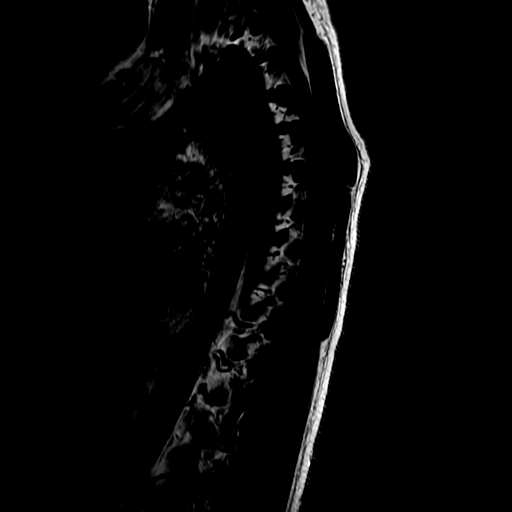

[Series 13: T2 · axial · 4.0mm · 0.39mm/px · z∈[-403,-103]mm · 3 of 55 slices shown (4 of 6)]
[im 1/55]
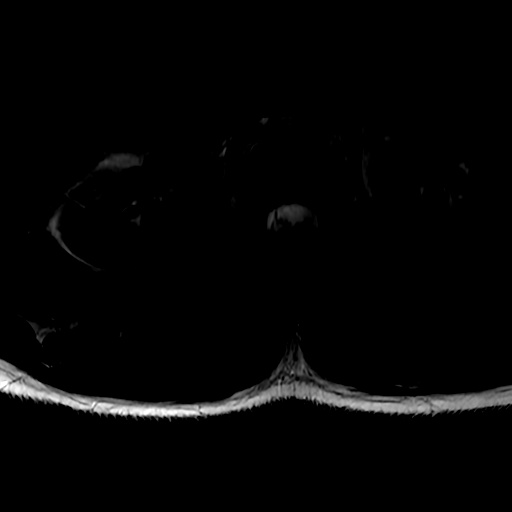
[im 28/55]
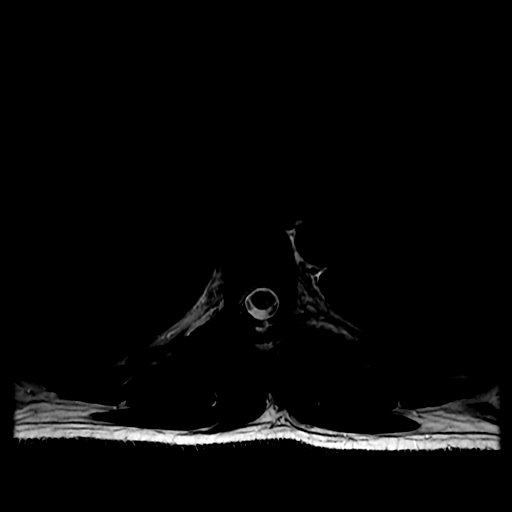
[im 55/55]
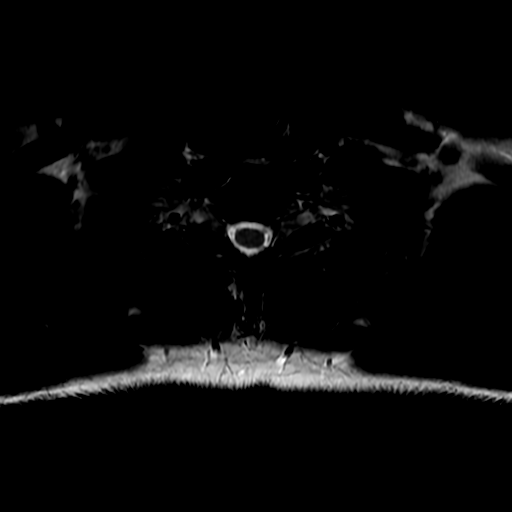

[Series 15: T1 · axial · non-contrast · 4.0mm · 0.39mm/px · z∈[-403,-108]mm · 3 of 55 slices shown (4 of 5)]
[im 1/55]
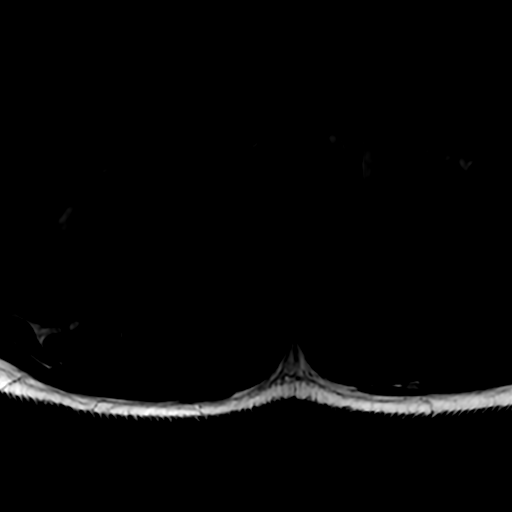
[im 28/55]
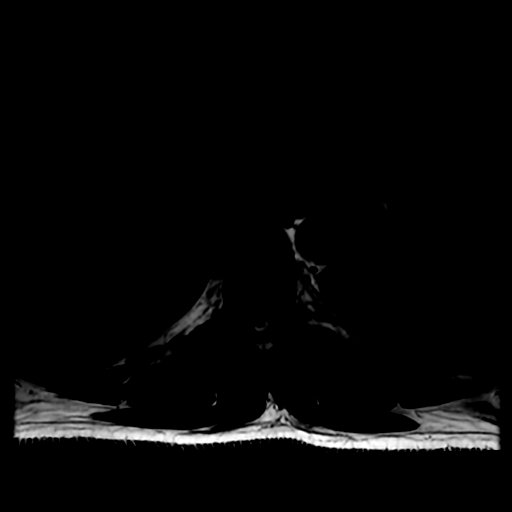
[im 55/55]
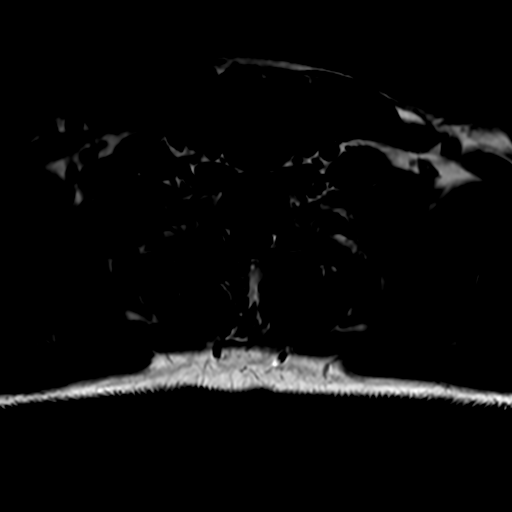

[Series 17: T2 · sagittal · 4.0mm · 0.55mm/px · 1 of 15 slices shown (5 of 6)]
[im 1/15]
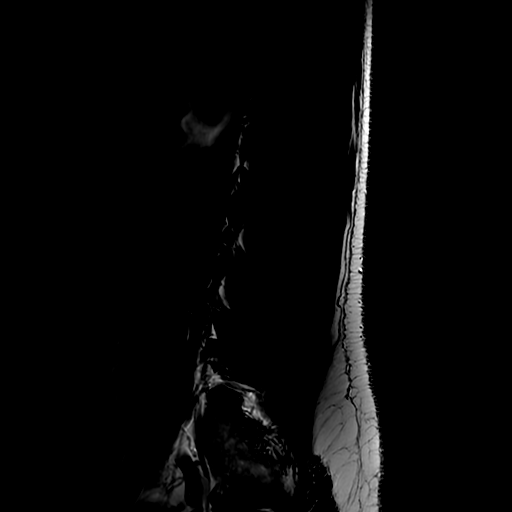

[Series 19: T1 · sagittal · 4.0mm · 0.55mm/px · 1 of 15 slices shown (5 of 5)]
[im 1/15]
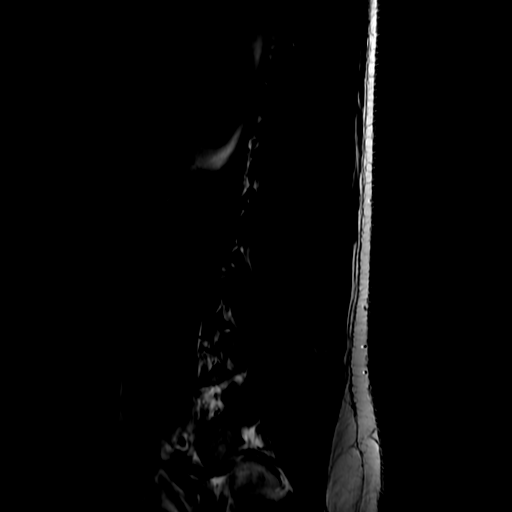

[Series 21: T2 · axial · 4.0mm · 0.39mm/px · z∈[-576,-376]mm · 2 of 41 slices shown (6 of 6)]
[im 1/41]
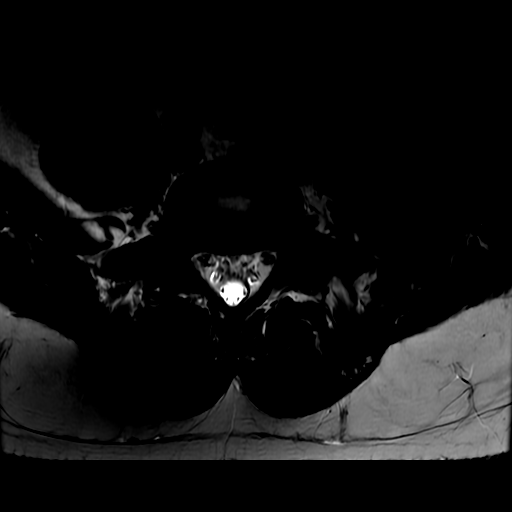
[im 41/41]
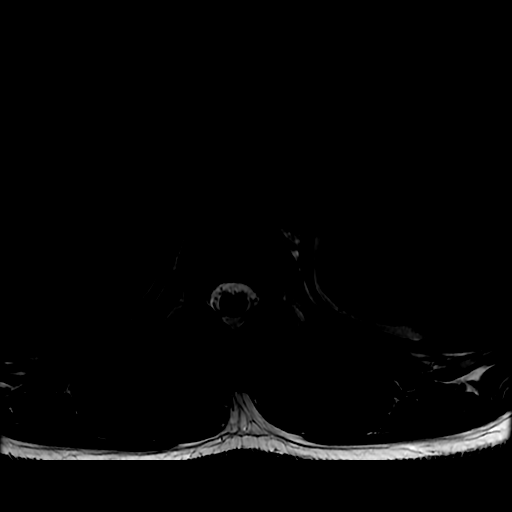

[19 of 48 positions shown; findings below may reference images not displayed]

FINDINGS: MRI CERVICAL SPINE FINDINGS

Alignment: Straightening of the cervical spine.

Vertebrae: No fracture, evidence of discitis, or bone lesion.

Cord: When allowing for motion artifact on axial acquisitions there
is normal cord signal and morphology. No abnormal intrathecal
enhancement.

Posterior Fossa, vertebral arteries, paraspinal tissues: Negative

Disc levels:

Negative facets and maintained disc height and hydration. No
impingement

MRI THORACIC SPINE FINDINGS

Alignment:  Slight curvature which could be positional.

Vertebrae: No fracture, evidence of discitis, or bone lesion.

Cord:  Normal signal and morphology.

Paraspinal and other soft tissues: Negative.

Disc levels:

Well preserved disc height and hydration.  No impingement

MRI LUMBAR SPINE FINDINGS

Segmentation:  Standard.

Alignment:  Physiologic.

Vertebrae:  No fracture, evidence of discitis, or bone lesion.

Conus medullaris: Extends to the T12-L1 level and appears normal.

Paraspinal and other soft tissues: Negative

Disc levels:

Well preserved disc height and hydration. Negative facets. No
impingement
IMPRESSION: Unremarkable MRI of the spine. The cord and nerve roots have a
normal appearance.

## 2019-09-01 IMAGING — MR MR HEAD WO/W CM
13 of 15 series · 40 of 48 positions shown · IV contrast (gadavist)
Comparison: None available.

CLINICAL DATA: Initial evaluation for intermittent headaches,
jerking.

EXAM:
MRI HEAD WITHOUT AND WITH CONTRAST
TECHNIQUE: Multiplanar, multiecho pulse sequences of the brain and surrounding
structures were obtained without and with intravenous contrast.
CONTRAST:  7mL GADAVIST GADOBUTROL 1 MMOL/ML IV SOLN

[Series 5: DWI · axial · 3.0mm · 0.88mm/px · z∈[-88,+45]mm · 5 of 94 slices shown (1 of 4)]
[im 1/94]
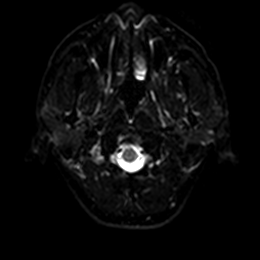
[im 24/94]
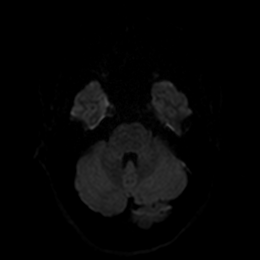
[im 47/94]
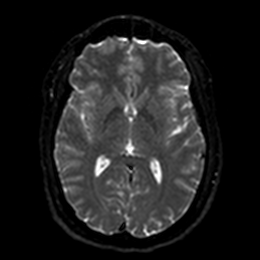
[im 70/94]
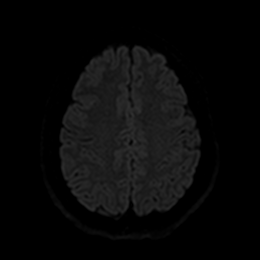
[im 94/94]
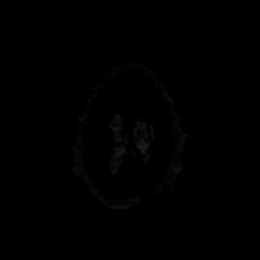

[Series 6: DWI · axial · 3.0mm · 0.88mm/px · z∈[-88,+45]mm · 3 of 47 slices shown (2 of 4)]
[im 1/47]
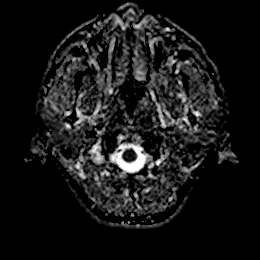
[im 24/47]
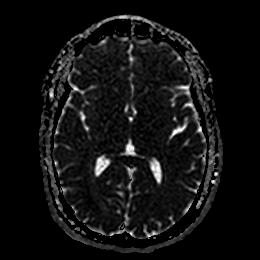
[im 47/47]
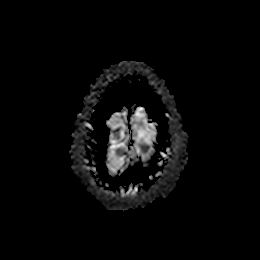

[Series 7: DWI · coronal · 4.0mm · 0.88mm/px · 5 of 74 slices shown (3 of 4)]
[im 1/74]
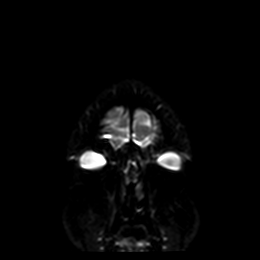
[im 19/74]
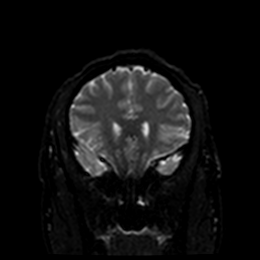
[im 37/74]
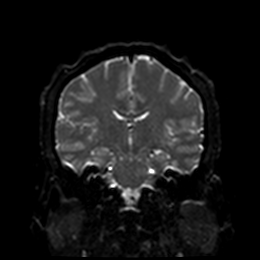
[im 55/74]
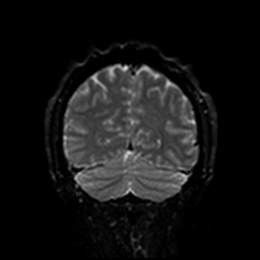
[im 74/74]
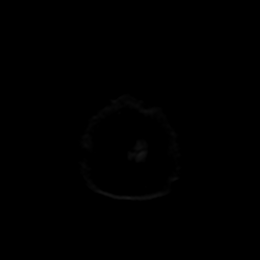

[Series 8: DWI · coronal · 4.0mm · 0.88mm/px · 2 of 37 slices shown (4 of 4)]
[im 1/37]
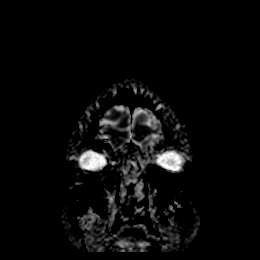
[im 37/37]
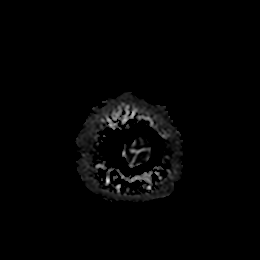

[Series 9: T1 · sagittal · 5.0mm · 0.75mm/px · 2 of 23 slices shown]
[im 1/23]
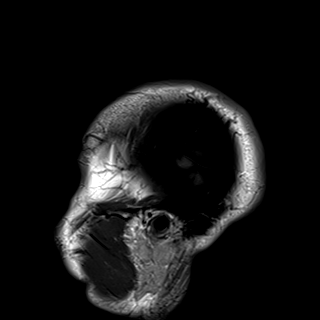
[im 23/23]
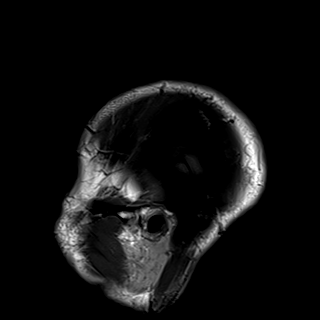

[Series 10: T2 · axial · 5.0mm · 0.72mm/px · z∈[-92,+47]mm · 2 of 25 slices shown]
[im 1/25]
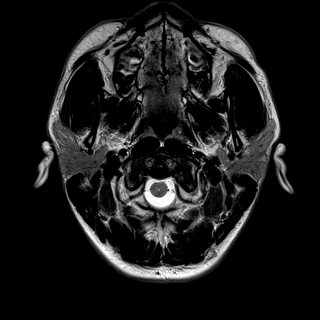
[im 25/25]
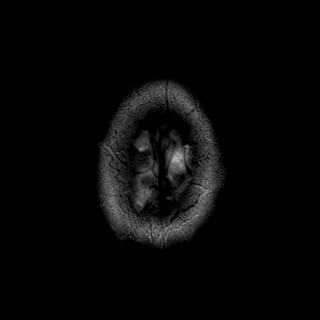

[Series 11: FLAIR · axial · 5.0mm · 0.45mm/px · z∈[-93,+46]mm · 2 of 25 slices shown]
[im 1/25]
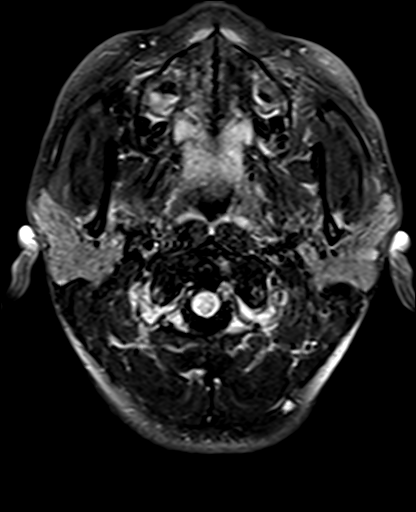
[im 25/25]
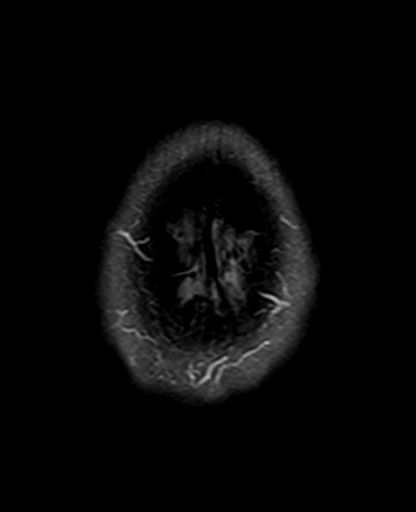

[Series 12: mag_images · axial · 3.0mm · 0.90mm/px · z∈[-103,+68]mm · 4 of 60 slices shown]
[im 1/60]
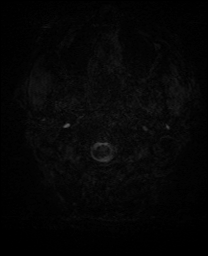
[im 20/60]
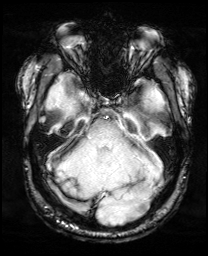
[im 40/60]
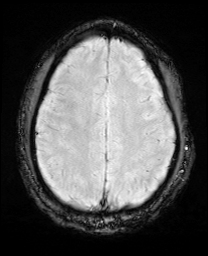
[im 60/60]
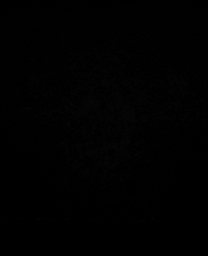

[Series 13: pha_images · axial · 3.0mm · 0.90mm/px · z∈[-103,+57]mm · 4 of 55 slices shown]
[im 1/55]
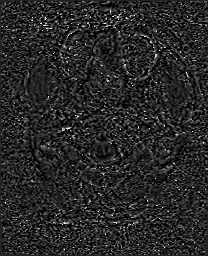
[im 19/55]
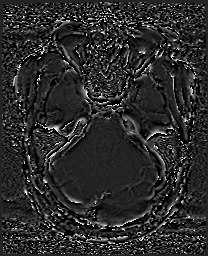
[im 37/55]
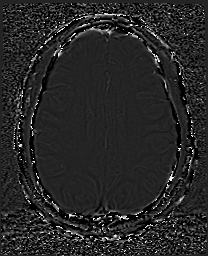
[im 55/55]
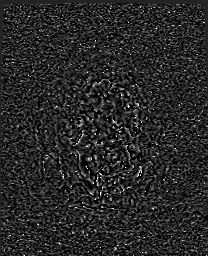

[Series 14: swi_images · axial · 3.0mm · 0.90mm/px · z∈[-103,+68]mm · 4 of 60 slices shown]
[im 1/60]
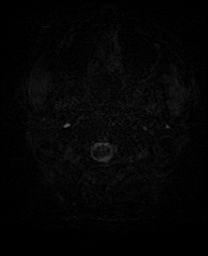
[im 20/60]
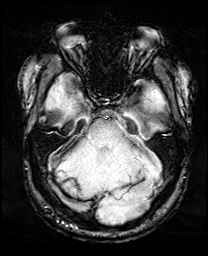
[im 40/60]
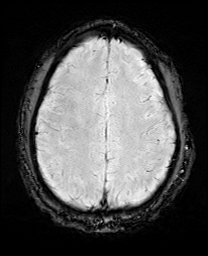
[im 60/60]
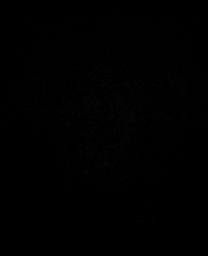

[Series 15: mip_images(sw) · axial · 24.0mm · 0.90mm/px · z∈[-93,+58]mm · 3 of 53 slices shown]
[im 1/53]
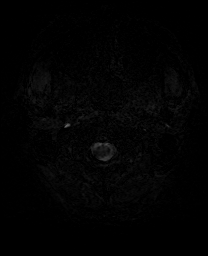
[im 27/53]
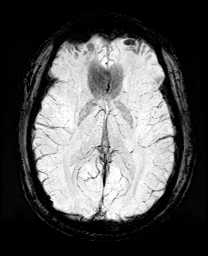
[im 53/53]
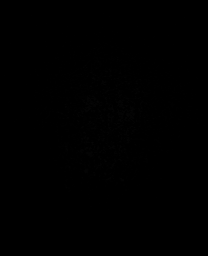

[Series 21: T2 post-contrast · coronal · 5.0mm · 0.72mm/px · 2 of 31 slices shown]
[im 1/31]
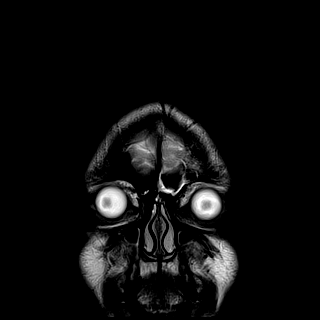
[im 31/31]
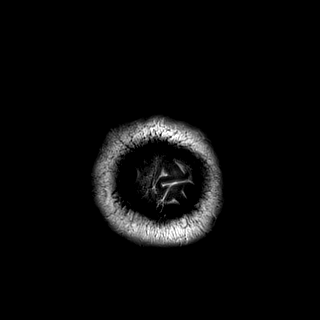

[Series 23: T1 post-contrast · coronal · 5.0mm · 0.34mm/px · 2 of 31 slices shown]
[im 1/31]
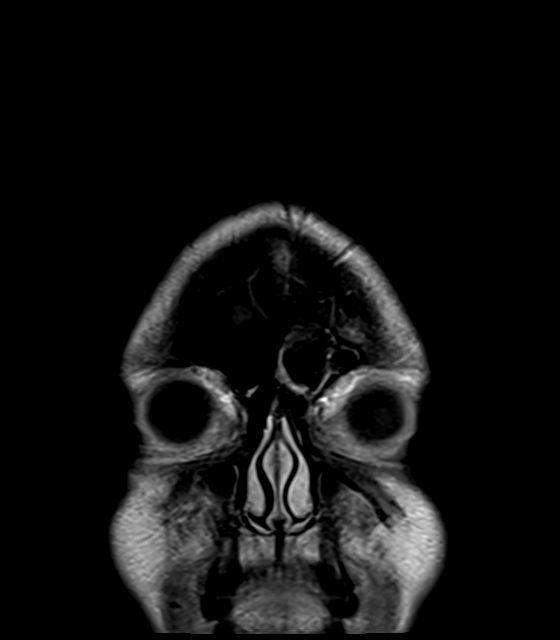
[im 31/31]
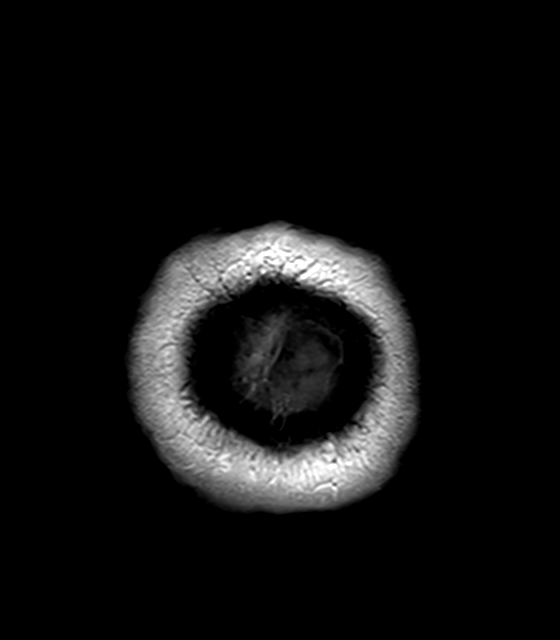

[40 of 48 positions shown; findings below may reference images not displayed]

FINDINGS: Brain: Cerebral volume within normal limits for patient age. No
focal parenchymal signal abnormality identified.

No abnormal foci of restricted diffusion to suggest acute or
subacute ischemia. Gray-white matter differentiation well
maintained. No encephalomalacia to suggest chronic infarction. No
foci of susceptibility artifact to suggest acute or chronic
intracranial hemorrhage.

No mass lesion, midline shift or mass effect. No hydrocephalus. No
extra-axial fluid collection. Major dural sinuses are grossly
patent.

Pituitary gland and suprasellar region are normal. Midline
structures intact and normal.

No abnormal enhancement.

Vascular: Major intracranial vascular flow voids well maintained and
normal in appearance.

Skull and upper cervical spine: Craniocervical junction normal.
Visualized upper cervical spine within normal limits. Bone marrow
signal intensity normal. No scalp soft tissue abnormality.

Sinuses/Orbits: Globes and orbital soft tissues within normal
limits.

Mild scattered mucosal thickening noted throughout the paranasal
sinuses. No significant sinus opacification or air-fluid level.
Trace opacity noted within the right mastoid air cells, of doubtful
significance. Inner ear structures normal.

Other: None.
IMPRESSION: Normal brain MRI.  No acute intracranial abnormality identified.

## 2019-09-01 IMAGING — MR MR LUMBAR SPINE WO/W CM
11 of 25 series · 19 of 48 positions shown · IV contrast (gadavist)
Comparison: None.

CLINICAL DATA: Demyelinating disease. Headache and jerking
movements

EXAM:
MRI TOTAL SPINE WITHOUT AND WITH CONTRAST
TECHNIQUE: Multisequence MR imaging of the spine from the cervical spine to the
sacrum was performed prior to and following IV contrast
administration for evaluation of spinal metastatic disease.
CONTRAST:  10mL GADAVIST GADOBUTROL 1 MMOL/ML IV SOLN

[Series 2: T2 · sagittal · 3.0mm · 0.43mm/px · 2 of 16 slices shown (1 of 6)]
[im 1/16]
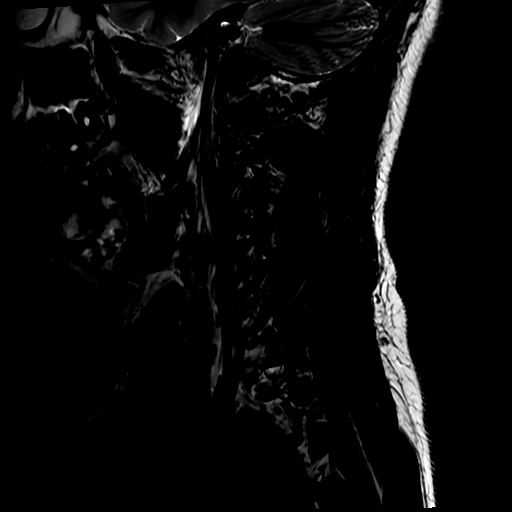
[im 16/16]
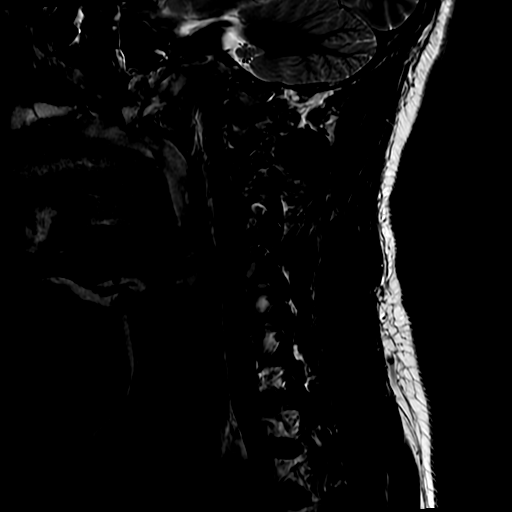

[Series 6: T2 · axial · 3.0mm · 0.35mm/px · z∈[-139,-10]mm · 2 of 39 slices shown (2 of 6)]
[im 1/39]
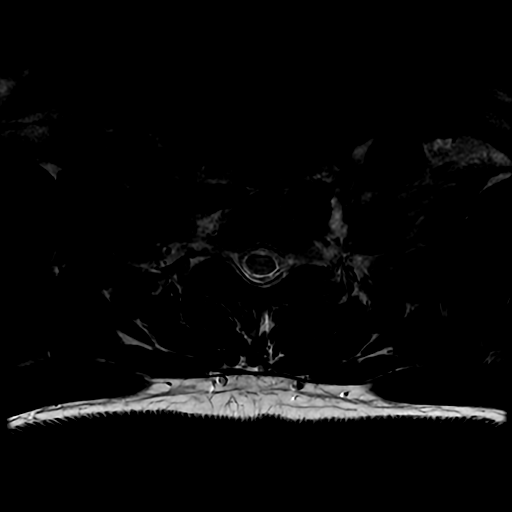
[im 39/39]
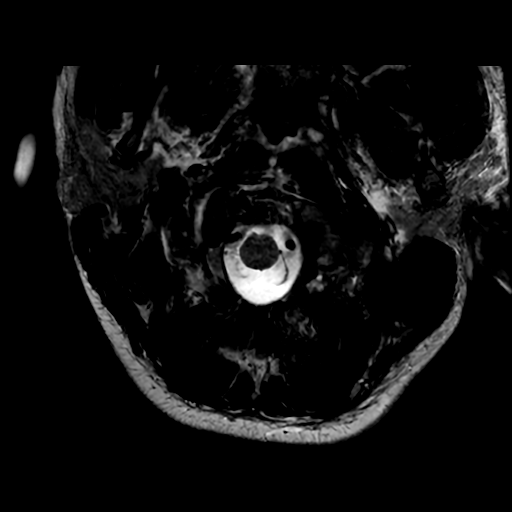

[Series 7: T1 · axial · non-contrast · 3.0mm · 0.35mm/px · z∈[-139,-10]mm · 2 of 40 slices shown (1 of 5)]
[im 1/40]
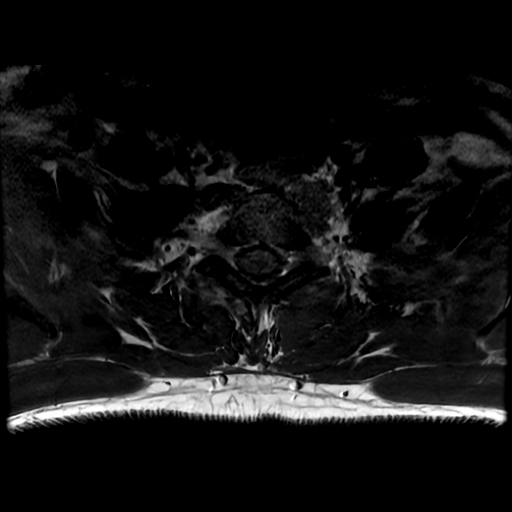
[im 40/40]
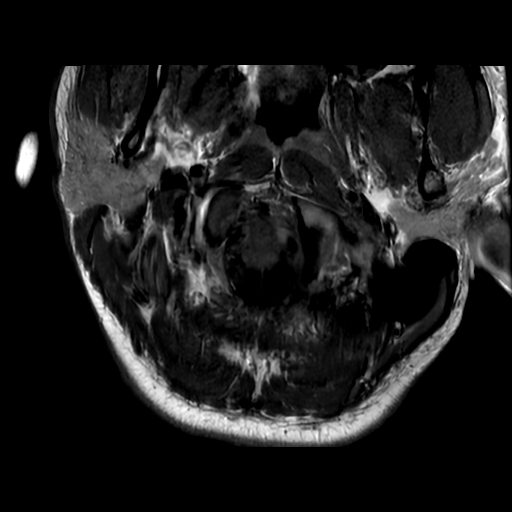

[Series 9: T1 · sagittal · 3.0mm · 0.90mm/px · 1 of 12 slices shown (2 of 5)]
[im 1/12]
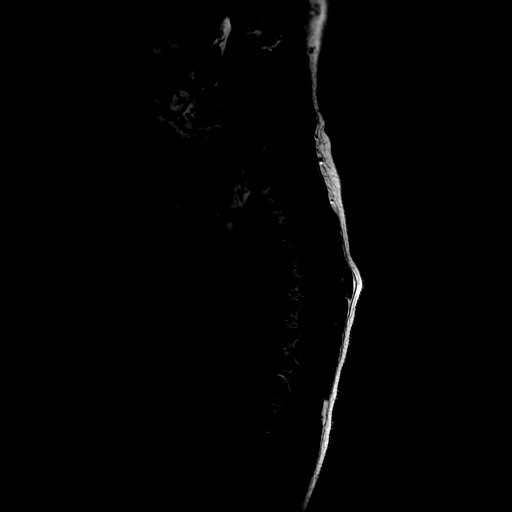

[Series 10: T2 · sagittal · 3.0mm · 0.66mm/px · 1 of 15 slices shown (3 of 6)]
[im 1/15]
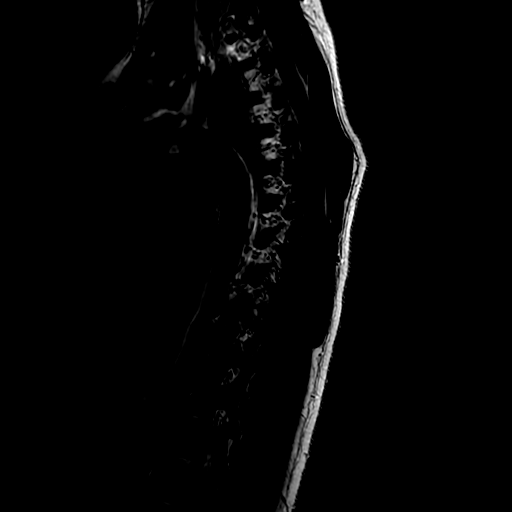

[Series 11: T1 · sagittal · 3.0mm · 0.66mm/px · 1 of 15 slices shown (3 of 5)]
[im 1/15]
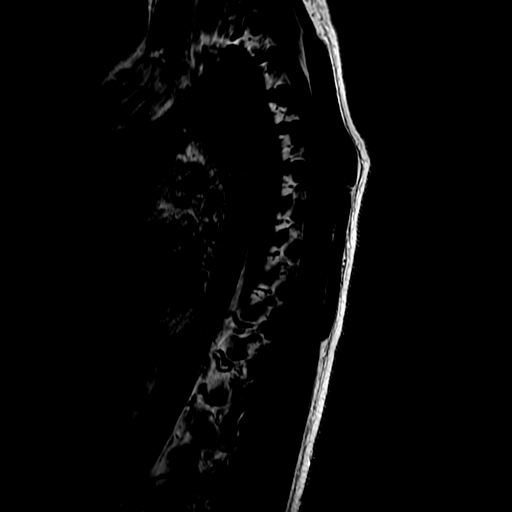

[Series 13: T2 · axial · 4.0mm · 0.39mm/px · z∈[-403,-103]mm · 3 of 55 slices shown (4 of 6)]
[im 1/55]
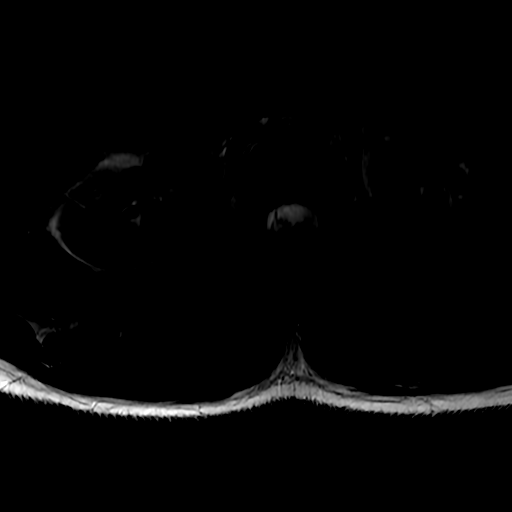
[im 28/55]
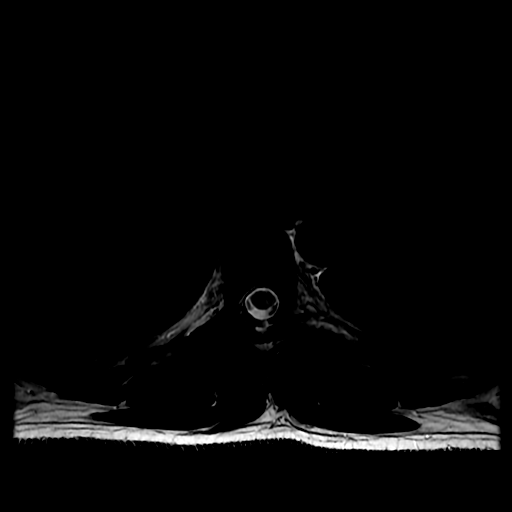
[im 55/55]
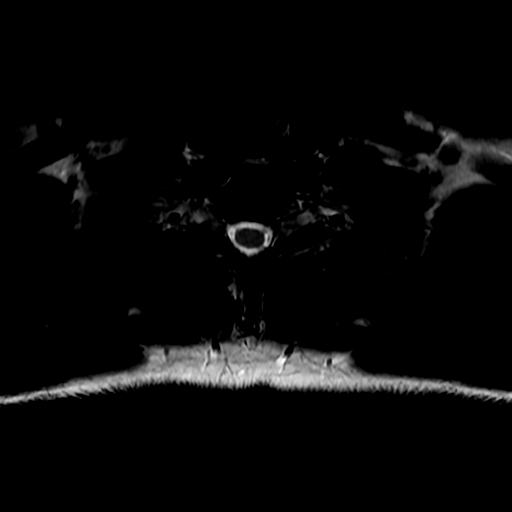

[Series 15: T1 · axial · non-contrast · 4.0mm · 0.39mm/px · z∈[-403,-108]mm · 3 of 55 slices shown (4 of 5)]
[im 1/55]
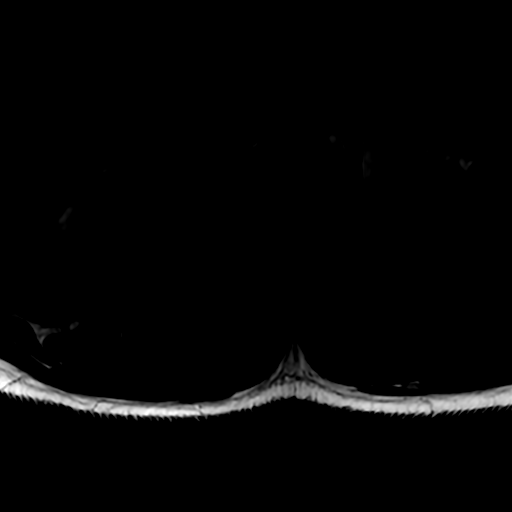
[im 28/55]
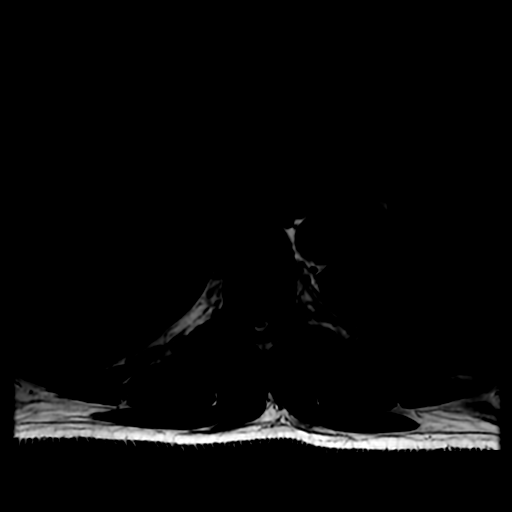
[im 55/55]
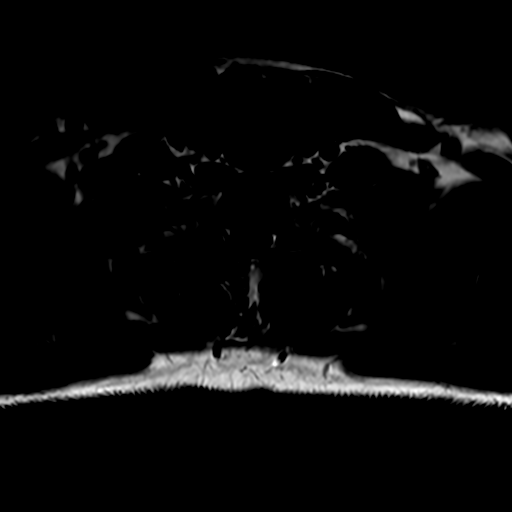

[Series 17: T2 · sagittal · 4.0mm · 0.55mm/px · 1 of 15 slices shown (5 of 6)]
[im 1/15]
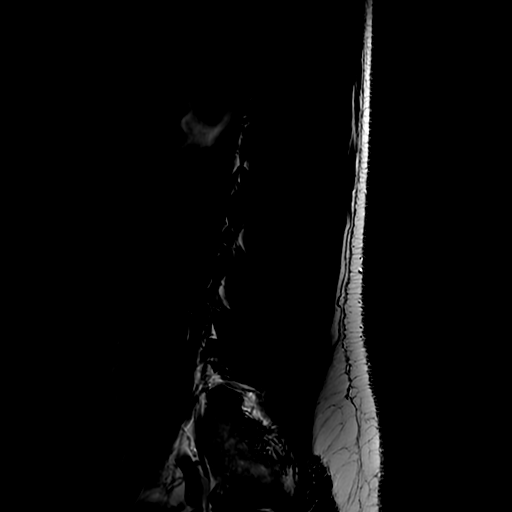

[Series 19: T1 · sagittal · 4.0mm · 0.55mm/px · 1 of 15 slices shown (5 of 5)]
[im 1/15]
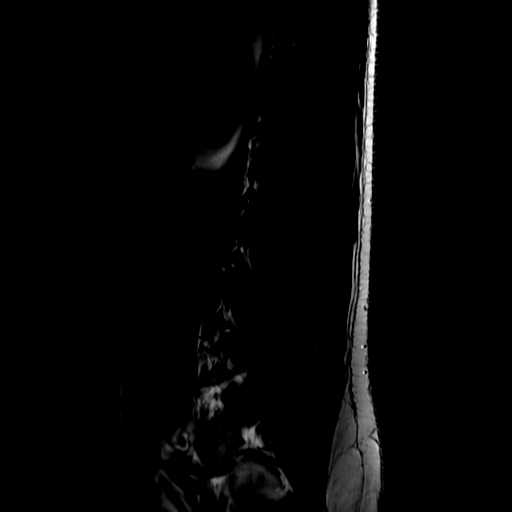

[Series 21: T2 · axial · 4.0mm · 0.39mm/px · z∈[-576,-376]mm · 2 of 41 slices shown (6 of 6)]
[im 1/41]
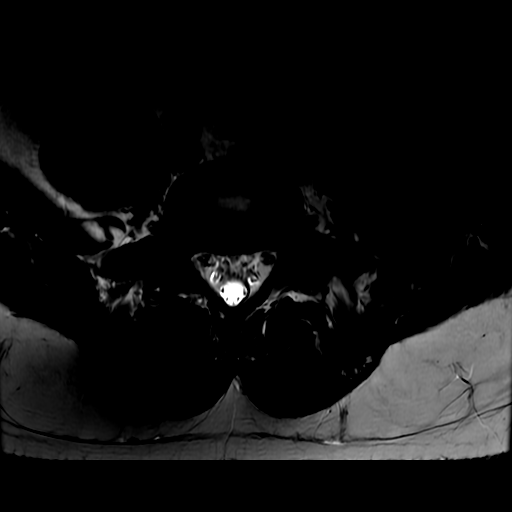
[im 41/41]
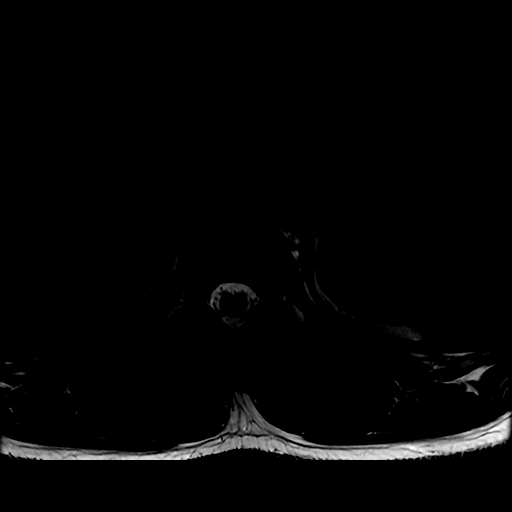

[19 of 48 positions shown; findings below may reference images not displayed]

FINDINGS: MRI CERVICAL SPINE FINDINGS

Alignment: Straightening of the cervical spine.

Vertebrae: No fracture, evidence of discitis, or bone lesion.

Cord: When allowing for motion artifact on axial acquisitions there
is normal cord signal and morphology. No abnormal intrathecal
enhancement.

Posterior Fossa, vertebral arteries, paraspinal tissues: Negative

Disc levels:

Negative facets and maintained disc height and hydration. No
impingement

MRI THORACIC SPINE FINDINGS

Alignment:  Slight curvature which could be positional.

Vertebrae: No fracture, evidence of discitis, or bone lesion.

Cord:  Normal signal and morphology.

Paraspinal and other soft tissues: Negative.

Disc levels:

Well preserved disc height and hydration.  No impingement

MRI LUMBAR SPINE FINDINGS

Segmentation:  Standard.

Alignment:  Physiologic.

Vertebrae:  No fracture, evidence of discitis, or bone lesion.

Conus medullaris: Extends to the T12-L1 level and appears normal.

Paraspinal and other soft tissues: Negative

Disc levels:

Well preserved disc height and hydration. Negative facets. No
impingement
IMPRESSION: Unremarkable MRI of the spine. The cord and nerve roots have a
normal appearance.

## 2019-09-01 MED ORDER — AMLODIPINE BESYLATE 5 MG PO TABS: 5.0000 mg | ORAL_TABLET | Freq: Every day | ORAL | 0 refills | Status: DC

## 2019-09-01 MED ORDER — GADOBUTROL 1 MMOL/ML IV SOLN
10.0000 mL | Freq: Once | INTRAVENOUS | Status: AC | PRN
  Administered 2019-09-01: 10 mL via INTRAVENOUS

## 2019-09-01 MED ORDER — SODIUM CHLORIDE 0.9 % IV BOLUS
1000.0000 mL | Freq: Once | INTRAVENOUS | Status: AC
  Administered 2019-09-01: 1000 mL via INTRAVENOUS

## 2019-09-01 MED ORDER — GADOBUTROL 1 MMOL/ML IV SOLN
7.0000 mL | Freq: Once | INTRAVENOUS | Status: AC | PRN
  Administered 2019-09-01: 7 mL via INTRAVENOUS

## 2019-09-01 MED ORDER — PROCHLORPERAZINE EDISYLATE 10 MG/2ML IJ SOLN
10.0000 mg | Freq: Once | INTRAMUSCULAR | Status: AC
  Administered 2019-09-01: 10 mg via INTRAVENOUS
  Filled 2019-09-01: qty 2

## 2019-09-01 MED ORDER — LORAZEPAM 2 MG/ML IJ SOLN
1.0000 mg | INTRAMUSCULAR | Status: DC | PRN
  Administered 2019-09-01 (×2): 1 mg via INTRAVENOUS
  Filled 2019-09-01 (×2): qty 1

## 2019-09-01 MED ORDER — DIPHENHYDRAMINE HCL 50 MG/ML IJ SOLN
25.0000 mg | Freq: Once | INTRAMUSCULAR | Status: AC
  Administered 2019-09-01: 25 mg via INTRAVENOUS
  Filled 2019-09-01: qty 1

## 2019-09-01 NOTE — Progress Notes (Signed)
EEG complete - results pending 

## 2019-09-01 NOTE — Discharge Summary (Addendum)
NEUROLOGY- Discharge Summary  Patient ID: Eugene Sanders   MRN: [id number]      DOB: 1990-09-03  Date of Admission: 08/31/2019 Date of Discharge: 09/01/2019  Attending Physician:  Milon Dikes, MD - admitting.  Attending Physician:  Milon Dikes, MD - discharging.   Consultant(s):    None  Patient's PCP:  Patient, No Pcp Per  DISCHARGE DIAGNOSIS:  Active Problems:   Myoclonus Physiological myoclonus  Allergies as of 09/01/2019   No Known Allergies     Medication List    STOP taking these medications   buPROPion 150 MG 12 hr tablet Commonly known as: WELLBUTRIN SR   traZODone 100 MG tablet Commonly known as: DESYREL     TAKE these medications   amLODipine 5 MG tablet Commonly known as: NORVASC Take 1 tablet (5 mg total) by mouth daily.       LABORATORY STUDIES CBC    Component Value Date/Time   WBC 6.0 08/31/2019 2215   RBC 5.20 08/31/2019 2215   HGB 15.3 08/31/2019 2215   HCT 46.6 08/31/2019 2215   PLT 310 08/31/2019 2215   MCV 89.6 08/31/2019 2215   MCH 29.4 08/31/2019 2215   MCHC 32.8 08/31/2019 2215   RDW 11.9 08/31/2019 2215   CMP    Component Value Date/Time   NA 138 08/31/2019 2215   K 4.0 08/31/2019 2215   CL 101 08/31/2019 2215   CO2 26 08/31/2019 2215   GLUCOSE 90 08/31/2019 2215   BUN 17 08/31/2019 2215   CREATININE 1.18 08/31/2019 2215   CALCIUM 9.5 08/31/2019 2215   PROT 7.8 09/01/2013 1905   ALBUMIN 4.6 09/01/2013 1905   AST 24 09/01/2013 1905   ALT 12 09/01/2013 1905   ALKPHOS 43 09/01/2013 1905   BILITOT 0.4 09/01/2013 1905   GFRNONAA >60 08/31/2019 2215   GFRAA >60 08/31/2019 2215   COAGSNo results found for: INR, PROTIME Lipid PanelNo results found for: CHOL, TRIG, HDL, CHOLHDL, VLDL, LDLCALC HgbA1C  Lab Results  Component Value Date   HGBA1C 5.3 09/01/2019   Urinalysis No results found for: COLORURINE, APPEARANCEUR, LABSPEC, PHURINE, GLUCOSEU, HGBUR, BILIRUBINUR, KETONESUR, PROTEINUR, UROBILINOGEN, NITRITE,  LEUKOCYTESUR Urine Drug Screen     Component Value Date/Time   LABOPIA NONE DETECTED 09/01/2019 0017   COCAINSCRNUR NONE DETECTED 09/01/2019 0017   LABBENZ NONE DETECTED 09/01/2019 0017   AMPHETMU NONE DETECTED 09/01/2019 0017   THCU NONE DETECTED 09/01/2019 0017   LABBARB NONE DETECTED 09/01/2019 0017    Alcohol Level    Component Value Date/Time   [facility] <11 09/01/2013 1905     SIGNIFICANT DIAGNOSTIC STUDIES EEG  Result Date: 09/01/2019 Charlsie Quest, MD     09/01/2019  1:42 PM Patient Name: Hughie Melroy MRN: [id number] Epilepsy Attending: Charlsie Quest Referring Physician/Provider: Dr. Milon Dikes Date: 09/01/2019 Duration: 31.24 minutes Patient history: 29 y.o.malewith past medical history significant for anxiety, asthma, depression, hypertension presents to the emergency department with 3-day history of headache as well as jerking movements any of his extremities that occur while he is about to sleep at night for the last 2 days. EEG to evaluate for seizures. Level of alertness: awake, asleep AEDs during EEG study: None Technical aspects: This EEG study was done with scalp electrodes positioned according to the 10-20 International system of electrode placement. Electrical activity was acquired at a sampling rate of 500Hz  and reviewed with a high frequency filter of 70Hz  and a low frequency filter of 1Hz . EEG data were recorded  continuously and digitally stored. DESCRIPTION:  The posterior dominant rhythm consists of 10-11 Hz activity of moderate voltage (25-35 uV) seen predominantly in posterior head regions, symmetric and reactive to eye opening and eye closing. Sleep was characterized by vertex waves, sleep spindles (12-14hz 0, maximal frontocentral.  Physiologic photic driving was seen and photic stimulation.  No EEG change was seen during hyperventilation. IMPRESSION: This study is within normal limits. No seizures or epileptiform discharges were seen throughout the recording.  Lora Havens   DG Chest 2 View  Result Date: 08/31/2019 CLINICAL DATA:  Chest pain EXAM: CHEST - 2 VIEW COMPARISON:  None. FINDINGS: The heart size and mediastinal contours are within normal limits. Both lungs are clear. The visualized skeletal structures are unremarkable. IMPRESSION: Normal study. Electronically Signed   By: Rolm Baptise M.D.   On: 08/31/2019 22:43   MR Brain W and Wo Contrast  Result Date: 09/01/2019 CLINICAL DATA:  Initial evaluation for intermittent headaches, jerking. EXAM: MRI HEAD WITHOUT AND WITH CONTRAST TECHNIQUE: Multiplanar, multiecho pulse sequences of the brain and surrounding structures were obtained without and with intravenous contrast. CONTRAST:  67mL GADAVIST GADOBUTROL 1 MMOL/ML IV SOLN COMPARISON:  None available. FINDINGS: Brain: Cerebral volume within normal limits for patient age. No focal parenchymal signal abnormality identified. No abnormal foci of restricted diffusion to suggest acute or subacute ischemia. Gray-white matter differentiation well maintained. No encephalomalacia to suggest chronic infarction. No foci of susceptibility artifact to suggest acute or chronic intracranial hemorrhage. No mass lesion, midline shift or mass effect. No hydrocephalus. No extra-axial fluid collection. Major dural sinuses are grossly patent. Pituitary gland and suprasellar region are normal. Midline structures intact and normal. No abnormal enhancement. Vascular: Major intracranial vascular flow voids well maintained and normal in appearance. Skull and upper cervical spine: Craniocervical junction normal. Visualized upper cervical spine within normal limits. Bone marrow signal intensity normal. No scalp soft tissue abnormality. Sinuses/Orbits: Globes and orbital soft tissues within normal limits. Mild scattered mucosal thickening noted throughout the paranasal sinuses. No significant sinus opacification or air-fluid level. Trace opacity noted within the right mastoid air  cells, of doubtful significance. Inner ear structures normal. Other: None. IMPRESSION: Normal brain MRI.  No acute intracranial abnormality identified. Electronically Signed   By: Jeannine Boga M.D.   On: 09/01/2019 02:44   MR CERVICAL SPINE W WO CONTRAST  Result Date: 09/01/2019 CLINICAL DATA:  Demyelinating disease. Headache and jerking movements EXAM: MRI TOTAL SPINE WITHOUT AND WITH CONTRAST TECHNIQUE: Multisequence MR imaging of the spine from the cervical spine to the sacrum was performed prior to and following IV contrast administration for evaluation of spinal metastatic disease. CONTRAST:  94mL GADAVIST GADOBUTROL 1 MMOL/ML IV SOLN COMPARISON:  None. FINDINGS: MRI CERVICAL SPINE FINDINGS Alignment: Straightening of the cervical spine. Vertebrae: No fracture, evidence of discitis, or bone lesion. Cord: When allowing for motion artifact on axial acquisitions there is normal cord signal and morphology. No abnormal intrathecal enhancement. Posterior Fossa, vertebral arteries, paraspinal tissues: Negative Disc levels: Negative facets and maintained disc height and hydration. No impingement MRI THORACIC SPINE FINDINGS Alignment:  Slight curvature which could be positional. Vertebrae: No fracture, evidence of discitis, or bone lesion. Cord:  Normal signal and morphology. Paraspinal and other soft tissues: Negative. Disc levels: Well preserved disc height and hydration.  No impingement MRI LUMBAR SPINE FINDINGS Segmentation:  Standard. Alignment:  Physiologic. Vertebrae:  No fracture, evidence of discitis, or bone lesion. Conus medullaris: Extends to the T12-L1 level and appears normal. Paraspinal and  other soft tissues: Negative Disc levels: Well preserved disc height and hydration. Negative facets. No impingement IMPRESSION: Unremarkable MRI of the spine. The cord and nerve roots have a normal appearance. Electronically Signed   By: Marnee SpringJonathon  Watts M.D.   On: 09/01/2019 10:04   MR THORACIC SPINE W WO  CONTRAST  Result Date: 09/01/2019 CLINICAL DATA:  Demyelinating disease. Headache and jerking movements EXAM: MRI TOTAL SPINE WITHOUT AND WITH CONTRAST TECHNIQUE: Multisequence MR imaging of the spine from the cervical spine to the sacrum was performed prior to and following IV contrast administration for evaluation of spinal metastatic disease. CONTRAST:  10mL GADAVIST GADOBUTROL 1 MMOL/ML IV SOLN COMPARISON:  None. FINDINGS: MRI CERVICAL SPINE FINDINGS Alignment: Straightening of the cervical spine. Vertebrae: No fracture, evidence of discitis, or bone lesion. Cord: When allowing for motion artifact on axial acquisitions there is normal cord signal and morphology. No abnormal intrathecal enhancement. Posterior Fossa, vertebral arteries, paraspinal tissues: Negative Disc levels: Negative facets and maintained disc height and hydration. No impingement MRI THORACIC SPINE FINDINGS Alignment:  Slight curvature which could be positional. Vertebrae: No fracture, evidence of discitis, or bone lesion. Cord:  Normal signal and morphology. Paraspinal and other soft tissues: Negative. Disc levels: Well preserved disc height and hydration.  No impingement MRI LUMBAR SPINE FINDINGS Segmentation:  Standard. Alignment:  Physiologic. Vertebrae:  No fracture, evidence of discitis, or bone lesion. Conus medullaris: Extends to the T12-L1 level and appears normal. Paraspinal and other soft tissues: Negative Disc levels: Well preserved disc height and hydration. Negative facets. No impingement IMPRESSION: Unremarkable MRI of the spine. The cord and nerve roots have a normal appearance. Electronically Signed   By: Marnee SpringJonathon  Watts M.D.   On: 09/01/2019 10:04   MR Lumbar Spine W Wo Contrast  Result Date: 09/01/2019 CLINICAL DATA:  Demyelinating disease. Headache and jerking movements EXAM: MRI TOTAL SPINE WITHOUT AND WITH CONTRAST TECHNIQUE: Multisequence MR imaging of the spine from the cervical spine to the sacrum was performed  prior to and following IV contrast administration for evaluation of spinal metastatic disease. CONTRAST:  10mL GADAVIST GADOBUTROL 1 MMOL/ML IV SOLN COMPARISON:  None. FINDINGS: MRI CERVICAL SPINE FINDINGS Alignment: Straightening of the cervical spine. Vertebrae: No fracture, evidence of discitis, or bone lesion. Cord: When allowing for motion artifact on axial acquisitions there is normal cord signal and morphology. No abnormal intrathecal enhancement. Posterior Fossa, vertebral arteries, paraspinal tissues: Negative Disc levels: Negative facets and maintained disc height and hydration. No impingement MRI THORACIC SPINE FINDINGS Alignment:  Slight curvature which could be positional. Vertebrae: No fracture, evidence of discitis, or bone lesion. Cord:  Normal signal and morphology. Paraspinal and other soft tissues: Negative. Disc levels: Well preserved disc height and hydration.  No impingement MRI LUMBAR SPINE FINDINGS Segmentation:  Standard. Alignment:  Physiologic. Vertebrae:  No fracture, evidence of discitis, or bone lesion. Conus medullaris: Extends to the T12-L1 level and appears normal. Paraspinal and other soft tissues: Negative Disc levels: Well preserved disc height and hydration. Negative facets. No impingement IMPRESSION: Unremarkable MRI of the spine. The cord and nerve roots have a normal appearance. Electronically Signed   By: Marnee SpringJonathon  Watts M.D.   On: 09/01/2019 10:04   EEG: IMPRESSION: This study is within normal limits. However, only wakefulness and drowsiness were recorded. If suspicion for interictal activity remains a concern, a prolonged study including sleep should be considered.  No seizures or epileptiform discharges were seen throughout the recording. The excessive beta activity seen in the background is  most likely due to the effect of benzodiazepine and is a benign EEG pattern.   HISTORY OF PRESENT ILLNESS-per Dr Laurence Slate Faith Rogue is a 29 y.o. male with past medical  history significant for anxiety, asthma, depression, hypertension presents to the emergency department with 3-day history of headache as well as jerking movements any of his extremities that occur while he is about to sleep at night for the last 2 days.  He also has numbness/tingling in his arms and legs that is also resolved.  ED course On evaluation by EDP patient noted to have 5 beats of ankle clonus in his right leg but otherwise no other neurological abnormalities on exam.  Work-up in ED included urine drug screen which was unremarkable, CK which was normal, MRI brain with and without contrast which was also normal.  He received a migraine cocktail for headache which is now completely resolved.  Neurology was consulted for further recommendations.  He has no family history of seizures.  No prior history of jerking movements, episodes of loss of consciousness.  He recently took a dose of Zoloft on 1/22 and 1/23 for anxiety, did not like the way the medication made him feel so discontinued.  Patient recently exposed to a friend diagnosed with COVID-19 so SARS-CoV-2 status pending.  HOSPITAL COURSE Patient was admitted for further evaluation. MRI brain-no acute changes MRI C-spine-no acute changes MRI T-spine-no acute changes MRI L-spine-no acute changes Repeat exam as below-mildly exaggerated reflexes but within normal limits for young person.  No clonus noted. 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  Advised to minimize Benadryl. Does not taking bupropion or trazodone as documented in the chart. Outpatient follow-up with primary care neurology as below. Outpatient psychiatry as needed   DISCHARGE EXAM Blood pressure 128/77, pulse 97, temperature 98.6 F (37 C), temperature source Oral, resp. rate 18, SpO2 99 %.  Constitutional: Appears well-developed and well-nourished.  Psych: Affect appropriate to situation Eyes:  No scleral injection HENT: No OP obstrucion Head: Normocephalic.  Cardiovascular: Normal rate and regular rhythm.  Respiratory: Effort normal, non-labored breathing GI: Soft.  No distension. There is no tenderness.  Skin: WDI  Neuro: Mental Status: Patient is awake, alert, oriented to person, place, month, year. Speech-no issues with naming, repeating, comprehension Cranial Nerves: II: Visual Fields are full.  III,IV, VI: EOMI without ptosis or diploplia. Pupils equal, round and reactive to light V: Facial sensation is symmetric to temperature VII: Facial movement is symmetric.  VIII: hearing is intact to voice X: Palat elevates symmetrically XI: Shoulder shrug is symmetric. XII: tongue is midline without atrophy or fasciculations.  Motor: Tone is normal. Bulk is normal. 5/5 strength was present in all four extremities.  No drift No aterixis Sensory: Sensation is symmetric to light touch and temperature in the arms and legs. Deep Tendon Reflexes: 2+ ankle jerk bilaterally, brisk 2+ bilateral knee jerk, brisk 2+ bilateral brachialis reflex, no Hoffmann's, no clonus, no cross adduction Plantars: Toes are downgoing bilaterally.  Cerebellar: FNF and HKS are intact bilaterally   Discharge Diet   Regular thin liquids  DISCHARGE PLAN  Disposition:  home  Ongoing stroke risk factor control by Primary Care Physician at time of discharge  Follow-up PCP  2 weeks.  Follow-up in [facility]  in 4 weeks, office to schedule an appointment.   45 minutes were spent preparing discharge.  -- Milon Dikes, MD [facility] Pager: [phone] If 7pm to 7am, please call  on call as listed on AMION.

## 2019-09-01 NOTE — H&P (Addendum)
Chief Complaint: Sudden jerking of arm, leg   History obtained from: Patient and Chart    HPI:                                                                                                                                       Eugene Sanders is a 29 y.o. male with past medical history significant for anxiety, asthma, depression, hypertension presents to the emergency department with 3-day history of headache as well as jerking movements any of his extremities that occur while he is about to sleep at night for the last 2 days.  He also has numbness/tingling in his arms and legs that is also resolved.  ED course On evaluation by EDP patient noted to have 5 beats of ankle clonus in his right leg but otherwise no other neurological abnormalities on exam.  Work-up in ED included urine drug screen which was unremarkable, CK which was normal, MRI brain with and without contrast which was also normal.  He received a migraine cocktail for headache which is now completely resolved.  Neurology was consulted for further recommendations.  He has no family history of seizures.  No prior history of jerking movements, episodes of loss of consciousness.  He recently took a dose of Zoloft on 1/22 and 1/23 for anxiety, did not like the way the medication made him feel so discontinued.  Patient recently exposed to a friend diagnosed with COVID-19 so SARS-CoV-2 status pending.   Past Medical History:  Diagnosis Date  . Anxiety   . Asthma   . Depression     History reviewed. No pertinent surgical history.  No family history on file. Social History:  reports that he is a non-smoker but has been exposed to tobacco smoke. He has never used smokeless tobacco. He reports current alcohol use. He reports current drug use. Drug: Marijuana.  Allergies: No Known Allergies  Medications:                                                                                                                        I  reviewed home medications   ROS:  14 systems reviewed and negative except above    Examination:                                                                                                      General: Appears well-developed and well-nourished.  Psych: Affect appropriate to situation Eyes: No scleral injection HENT: No OP obstrucion Head: Normocephalic.  Cardiovascular: Normal rate and regular rhythm.  Respiratory: Effort normal and breath sounds normal to anterior ascultation GI: Soft.  No distension. There is no tenderness.  Skin: WDI    Neurological Examination Mental Status: Alert, oriented, thought content appropriate.  Speech fluent without evidence of aphasia. Able to follow 3 step commands without difficulty. Cranial Nerves: II: Visual fields grossly normal,  III,IV, VI: ptosis not present, extra-ocular motions intact bilaterally, pupils equal, round, reactive to light and accommodation V,VII: smile symmetric, facial light touch sensation normal bilaterally VIII: hearing normal bilaterally IX,X: uvula rises symmetrically XI: bilateral shoulder shrug XII: midline tongue extension Motor: Right : Upper extremity   5/5    Left:     Upper extremity   5/5  Lower extremity   5/5     Lower extremity   5/5 Tone and bulk:normal tone throughout; no atrophy noted Sensory: Pinprick and light touch intact throughout, bilaterally Deep Tendon Reflexes: 2+ reflexes bilaterally for biceps, triceps and brachioradialis.  Hoffman's negative bilaterally.  2+ reflexes for patella and ankle on the right side, 4+ patellar reflex with cross adductor', 3+ ankle reflex Tone is normal Plantars: Right: downgoing   Left: downgoing Cerebellar: normal finger-to-nose, normal rapid alternating movements and normal heel-to-shin test      Lab Results: Basic  Metabolic Panel: Recent Labs  Lab 08/31/19 2215  NA 138  K 4.0  CL 101  CO2 26  GLUCOSE 90  BUN 17  CREATININE 1.18  CALCIUM 9.5    CBC: Recent Labs  Lab 08/31/19 2215  WBC 6.0  HGB 15.3  HCT 46.6  MCV 89.6  PLT 310    Coagulation Studies: No results for input(s): LABPROT, INR in the last 72 hours.  Imaging: DG Chest 2 View  Result Date: 08/31/2019 CLINICAL DATA:  Chest pain EXAM: CHEST - 2 VIEW COMPARISON:  None. FINDINGS: The heart size and mediastinal contours are within normal limits. Both lungs are clear. The visualized skeletal structures are unremarkable. IMPRESSION: Normal study. Electronically Signed   By: Charlett Nose M.D.   On: 08/31/2019 22:43   MR Brain W and Wo Contrast  Result Date: 09/01/2019 CLINICAL DATA:  Initial evaluation for intermittent headaches, jerking. EXAM: MRI HEAD WITHOUT AND WITH CONTRAST TECHNIQUE: Multiplanar, multiecho pulse sequences of the brain and surrounding structures were obtained without and with intravenous contrast. CONTRAST:  64mL GADAVIST GADOBUTROL 1 MMOL/ML IV SOLN COMPARISON:  None available. FINDINGS: Brain: Cerebral volume within normal limits for patient age. No focal parenchymal signal abnormality identified. No abnormal foci of restricted diffusion to suggest acute or subacute ischemia. Gray-white matter differentiation well maintained. No encephalomalacia to suggest chronic infarction. No foci of susceptibility artifact to suggest acute or chronic intracranial  hemorrhage. No mass lesion, midline shift or mass effect. No hydrocephalus. No extra-axial fluid collection. Major dural sinuses are grossly patent. Pituitary gland and suprasellar region are normal. Midline structures intact and normal. No abnormal enhancement. Vascular: Major intracranial vascular flow voids well maintained and normal in appearance. Skull and upper cervical spine: Craniocervical junction normal. Visualized upper cervical spine within normal limits. Bone  marrow signal intensity normal. No scalp soft tissue abnormality. Sinuses/Orbits: Globes and orbital soft tissues within normal limits. Mild scattered mucosal thickening noted throughout the paranasal sinuses. No significant sinus opacification or air-fluid level. Trace opacity noted within the right mastoid air cells, of doubtful significance. Inner ear structures normal. Other: None. IMPRESSION: Normal brain MRI.  No acute intracranial abnormality identified. Electronically Signed   By: Rise Mu M.D.   On: 09/01/2019 02:44     I have reviewed the above imaging : Reviewed MRI brain with and without contrast: Appears normal   ASSESSMENT AND PLAN   29 y.o. male with past medical history significant for anxiety, asthma, depression, hypertension who presents to the emergency department with a headache to 3 days that has resolved with migraine cocktail, myoclonus of extremities during drowsiness, nonspecific intermittent paresthesias with pathological reflexes on examination.  Interestingly, I could not appreciate any ankle clonus in his right leg.  He did have brisk reflexes in the left patella and ankle with cross adductors.  I did not appreciate that in the right patella and ankle.    Myoclonus Pathological reflexes  D/D for myoclonus includes: Juvenile myoclonic epilepsy, narcolepsy, spinal myoclonus,  Medication use myoclonus (Zoloft, although it has been 2 days).  We will need to rule out spinal cord lesion/demyelination  for pathological reflex, especially given patient's age and history of intermittent numbness/paresthesia.  Plan Admit for observation to obtain further evaluation MRI C-spine, T-spine and L-spine with and without contrast:  Routine EEG to evaluate for epileptiform discharges   Laberta Wilbon [facility] Pager Number [phone]

## 2019-09-01 NOTE — Procedures (Signed)
Patient Name: Eugene Sanders  MRN: [id number]  Epilepsy Attending: Charlsie Quest  Referring Physician/Provider: Dr. Milon Dikes Date: 09/01/2019 Duration: 31.24 minutes  Patient history: 29 y.o.malewith past medical history significant for anxiety, asthma, depression, hypertension presents to the emergency department with 3-day history of headache as well as jerking movements any of his extremities that occur while he is about to sleep at night for the last 2 days. EEG to evaluate for seizures.   Level of alertness: awake, asleep  AEDs during EEG study: None  Technical aspects: This EEG study was done with scalp electrodes positioned according to the 10-20 International system of electrode placement. Electrical activity was acquired at a sampling rate of 500Hz  and reviewed with a high frequency filter of 70Hz  and a low frequency filter of 1Hz . EEG data were recorded continuously and digitally stored.   DESCRIPTION:  The posterior dominant rhythm consists of 10-11 Hz activity of moderate voltage (25-35 uV) seen predominantly in posterior head regions, symmetric and reactive to eye opening and eye closing. Sleep was characterized by vertex waves, sleep spindles (12-14hz 0, maximal frontocentral.  Physiologic photic driving was seen and photic stimulation.  No EEG change was seen during hyperventilation.  IMPRESSION: This study is within normal limits. No seizures or epileptiform discharges were seen throughout the recording.  Daisy Mcneel 

## 2019-09-01 NOTE — ED Notes (Signed)
MRI called to make RN aware that pt must wait 6 hours between contrast injections. Pt had MRI with contrast performed at 1:43 AM. MRI stated they will call ahead when ready so pt can be medicated prior to transport. Dr. Laurence Slate made aware of reason for delay.

## 2019-09-01 NOTE — ED Notes (Addendum)
This RN spoke with the pt regarding his anxiety regarding the MRI. Pt agreed to take the PRN Ativan and attempt to have the MRI. Transport at bedside.

## 2019-09-01 NOTE — Progress Notes (Signed)
Pt in MRI - will attempt EEG when pt is done.

## 2019-09-21 ENCOUNTER — Other Ambulatory Visit: Payer: Self-pay

## 2019-09-22 ENCOUNTER — Encounter: Payer: Self-pay | Admitting: Family Medicine

## 2019-09-22 ENCOUNTER — Ambulatory Visit (INDEPENDENT_AMBULATORY_CARE_PROVIDER_SITE_OTHER): Payer: Managed Care, Other (non HMO) | Admitting: Family Medicine

## 2019-09-22 VITALS — BP 128/70 | HR 87 | Temp 95.5°F | Resp 12 | Ht 68.25 in | Wt 194.0 lb

## 2019-09-22 DIAGNOSIS — Z23 Encounter for immunization: Secondary | ICD-10-CM | POA: Diagnosis not present

## 2019-09-22 DIAGNOSIS — F322 Major depressive disorder, single episode, severe without psychotic features: Secondary | ICD-10-CM | POA: Diagnosis not present

## 2019-09-22 DIAGNOSIS — R7989 Other specified abnormal findings of blood chemistry: Secondary | ICD-10-CM | POA: Diagnosis not present

## 2019-09-22 DIAGNOSIS — K219 Gastro-esophageal reflux disease without esophagitis: Secondary | ICD-10-CM

## 2019-09-22 DIAGNOSIS — I10 Essential (primary) hypertension: Secondary | ICD-10-CM

## 2019-09-22 DIAGNOSIS — F419 Anxiety disorder, unspecified: Secondary | ICD-10-CM

## 2019-09-22 DIAGNOSIS — R079 Chest pain, unspecified: Secondary | ICD-10-CM

## 2019-09-22 MED ORDER — HYDROXYZINE HCL 25 MG PO TABS: 25.0000 mg | ORAL_TABLET | Freq: Three times a day (TID) | ORAL | 1 refills | Status: DC | PRN

## 2019-09-22 MED ORDER — OMEPRAZOLE 40 MG PO CPDR: 40.0000 mg | DELAYED_RELEASE_CAPSULE | Freq: Every day | ORAL | 2 refills | Status: DC

## 2019-09-22 MED ORDER — AMLODIPINE BESYLATE 5 MG PO TABS: 5.0000 mg | ORAL_TABLET | Freq: Every day | ORAL | 1 refills | Status: DC

## 2019-09-22 NOTE — Assessment & Plan Note (Signed)
This could be causing his chest discomfort. GERD precautions discussed. He agrees with taking omeprazole 40 mg before breakfast for 6 to 8 weeks then as needed. Follow-up in 4 months.

## 2019-09-22 NOTE — Patient Instructions (Addendum)
A few things to remember from today's visit:   Abnormal TSH  Hypertension, essential, benign - Plan: amLODipine (NORVASC) 5 MG tablet  Gastroesophageal reflux disease, unspecified whether esophagitis present - Plan: omeprazole (PRILOSEC) 40 MG capsule  Major depressive disorder, single episode, severe (HCC), Chronic  Anxiety disorder, unspecified type - Plan: hydrOXYzine (ATARAX/VISTARIL) 25 MG tablet  Chest pain, unspecified type  Need for Tdap vaccination - Plan: Tdap vaccine greater than or equal to 7yo IM

## 2019-09-22 NOTE — Assessment & Plan Note (Signed)
He does not think he needs medication at this time. Instructed about warning signs.

## 2019-09-22 NOTE — Progress Notes (Signed)
HPI:   Eugene Sanders is a 29 y.o. male, who is here today to establish care.  Former PCP: N/A. Last preventive routine visit: > a year ago.  Chronic medical problems: HTN,depression,and anxiety among some.  HTN: Currently he is on Amlodipine 5 mg daily. Dx'ed 2-3 years ago. Negative for severe/frequent headache, visual changes, chest pain, dyspnea, palpitation, claudication, focal weakness, or edema. Home BP 120-140/?  Lab Results  Component Value Date   CREATININE 1.18 08/31/2019   BUN 17 08/31/2019   NA 138 08/31/2019   K 4.0 08/31/2019   CL 101 08/31/2019   CO2 26 08/31/2019   Recently evaluated in the ER (08/31/19) and admitted for 24 hours due to intermittent episodes of tremor while asleep. Less frequent episodes when he was sleeping on the floor.  Problem has improved, it lasted 2-3 weeks. No associated urine or bowel incontinence.  Brain,cervical,thoracic,and lumbar MRI otherwise negative. Lab Results  Component Value Date   WBC 6.0 08/31/2019   HGB 15.3 08/31/2019   HCT 46.6 08/31/2019   MCV 89.6 08/31/2019   PLT 310 08/31/2019   Lab Results  Component Value Date   TSH 4.619 (H) 09/01/2019   Negative for unusual fatigue,wt changes,diarrhea/constipation, or cold/heat intolerance.  C/O worsening anxiety. Hx of depression. Hospitalized in 08/2013 because depression and intentional drug overdose. Denies recent suicidal thoughts.   He has tried Zoloft but discontinued after a few days because he did not help. He lives with his mother.  Also c/o a months of intermittent mid chest pain,burning like sensation. Chest "weird" sensation that "moves around." Pain is not radiated. It is not associated with exertion or stress. Last a few hours. One times he felt like heart was racing, 10 days ago. Exacerbated by eating. No alleviating factors identified. He has not tried OTC medications.   Depression screen PHQ 2/9 09/22/2019  Decreased  Interest 2  Down, Depressed, Hopeless 2  PHQ - 2 Score 4  Altered sleeping 3  Tired, decreased energy 3  Change in appetite 0  Feeling bad or failure about yourself  0  Trouble concentrating 2  Moving slowly or fidgety/restless 1  Suicidal thoughts 0  PHQ-9 Score 13  Difficult doing work/chores Somewhat difficult   GAD 7 : Generalized Anxiety Score 09/22/2019  Nervous, Anxious, on Edge 3  Control/stop worrying 3  Worry too much - different things 3  Trouble relaxing 2  Restless 1  Easily annoyed or irritable 2  Afraid - awful might happen 2  Total GAD 7 Score 16  Anxiety Difficulty Somewhat difficult    Review of Systems  Constitutional: Negative for activity change, appetite change, fatigue and fever.  HENT: Negative for mouth sores, nosebleeds and sore throat.   Respiratory: Negative for cough and wheezing.   Gastrointestinal: Negative for abdominal pain, nausea and vomiting.  Musculoskeletal: Negative for gait problem and myalgias.  Allergic/Immunologic: Positive for environmental allergies.  Neurological: Negative for syncope, facial asymmetry and speech difficulty.  Psychiatric/Behavioral: Negative for confusion and hallucinations.  Rest see pertinent positives and negatives per HPI.   No current outpatient medications on file prior to visit.   No current facility-administered medications on file prior to visit.   Past Medical History:  Diagnosis Date  . Allergy   . Anxiety   . Depression   . Hypertension    No Known Allergies  Family History  Problem Relation Age of Onset  . Hypertension Mother   . Diabetes Father  Social History   Socioeconomic History  . Marital status: Single    Spouse name: Not on file  . Number of children: Not on file  . Years of education: Not on file  . Highest education level: Not on file  Occupational History  . Not on file  Tobacco Use  . Smoking status: Passive Smoke Exposure - Never Smoker  . Smokeless tobacco:  Never Used  Substance and Sexual Activity  . Alcohol use: Yes    Comment: rare  . Drug use: Yes    Types: Marijuana  . Sexual activity: Not on file  Other Topics Concern  . Not on file  Social History Narrative  . Not on file   Social Determinants of Health   Financial Resource Strain:   . Difficulty of Paying Living Expenses: Not on file  Food Insecurity:   . Worried About Programme researcher, broadcasting/film/video in the Last Year: Not on file  . Ran Out of Food in the Last Year: Not on file  Transportation Needs:   . Lack of Transportation (Medical): Not on file  . Lack of Transportation (Non-Medical): Not on file  Physical Activity:   . Days of Exercise per Week: Not on file  . Minutes of Exercise per Session: Not on file  Stress:   . Feeling of Stress : Not on file  Social Connections:   . Frequency of Communication with Friends and Family: Not on file  . Frequency of Social Gatherings with Friends and Family: Not on file  . Attends Religious Services: Not on file  . Active Member of Clubs or Organizations: Not on file  . Attends Banker Meetings: Not on file  . Marital Status: Not on file    Vitals:   09/22/19 0915  BP: 128/70  Pulse: 87  Resp: 12  Temp: (!) 95.5 F (35.3 C)  SpO2: 97%   Body mass index is 29.28 kg/m.  Physical Exam  Nursing note and vitals reviewed. Constitutional: He is oriented to person, place, and time. He appears well-developed. No distress.  HENT:  Head: Normocephalic and atraumatic.  Mouth/Throat: Oropharynx is clear and moist and mucous membranes are normal.  Eyes: Pupils are equal, round, and reactive to light. Conjunctivae are normal.  Cardiovascular: Normal rate and regular rhythm.  No murmur heard. Pulses:      Dorsalis pedis pulses are 2+ on the right side and 2+ on the left side.  Respiratory: Effort normal and breath sounds normal. No respiratory distress.  GI: Soft. He exhibits no mass. There is no hepatomegaly. There is no  abdominal tenderness.  Musculoskeletal:        General: No edema.  Lymphadenopathy:    He has no cervical adenopathy.  Neurological: He is alert and oriented to person, place, and time. He has normal strength. No cranial nerve deficit. Gait normal.  Skin: Skin is warm. No rash noted. No erythema.  Psychiatric: He has a normal mood and affect.  Well groomed, good eye contact.    ASSESSMENT AND PLAN:  Eugene Sanders was seen today for establish care.  Diagnoses and all orders for this visit:  Abnormal TSH Mild. Will plan on repeating TSH next visit.  Hypertension, essential, benign BP adequately controlled. Continue amlodipine 5 mg daily. Recommend monitoring BP at home and following low-salt diet.  Anxiety disorder He is not interested in daily medication, he is asking for something to take as needed. Hydroxyzine 25 mg to take 3 times per day  as needed. We discussed some side effects, including drowsiness. Instructed about warning signs.  Major depressive disorder, single episode, severe (HCC) He does not think he needs medication at this time. Instructed about warning signs.  Gastroesophageal reflux disease This could be causing his chest discomfort. GERD precautions discussed. He agrees with taking omeprazole 40 mg before breakfast for 6 to 8 weeks then as needed. Follow-up in 4 months.  Chest pain, unspecified type Possible etiologies discussed. The likelihood of being cardiac etiology is low,although never zero. Further studies will be considered if it is not better with PPI. Instructed about warning signs.  Need for Tdap vaccination -     Tdap vaccine greater than or equal to 7yo IM   Return in about 2 months (around 11/20/2019) for TSH,depression,anxiety.    Bianca Vester G. [place], MD  [facility]. [place] office.

## 2019-09-22 NOTE — Assessment & Plan Note (Signed)
BP adequately controlled. Continue amlodipine 5 mg daily. Recommend monitoring BP at home and following low-salt diet.

## 2019-09-22 NOTE — Assessment & Plan Note (Signed)
He is not interested in daily medication, he is asking for something to take as needed. Hydroxyzine 25 mg to take 3 times per day as needed. We discussed some side effects, including drowsiness. Instructed about warning signs.

## 2019-10-10 ENCOUNTER — Encounter (HOSPITAL_COMMUNITY): Payer: Self-pay

## 2019-10-10 ENCOUNTER — Other Ambulatory Visit: Payer: Self-pay

## 2019-10-10 ENCOUNTER — Emergency Department (HOSPITAL_COMMUNITY): Payer: Managed Care, Other (non HMO)

## 2019-10-10 ENCOUNTER — Emergency Department (HOSPITAL_COMMUNITY)
Admission: EM | Admit: 2019-10-10 | Discharge: 2019-10-10 | Disposition: A | Discharge status: Home / Self Care | Payer: Managed Care, Other (non HMO) | Attending: Emergency Medicine | Admitting: Emergency Medicine

## 2019-10-10 DIAGNOSIS — F419 Anxiety disorder, unspecified: Secondary | ICD-10-CM | POA: Insufficient documentation

## 2019-10-10 DIAGNOSIS — Z79899 Other long term (current) drug therapy: Secondary | ICD-10-CM | POA: Insufficient documentation

## 2019-10-10 DIAGNOSIS — Z20822 Contact with and (suspected) exposure to covid-19: Secondary | ICD-10-CM | POA: Insufficient documentation

## 2019-10-10 DIAGNOSIS — D72819 Decreased white blood cell count, unspecified: Secondary | ICD-10-CM | POA: Insufficient documentation

## 2019-10-10 DIAGNOSIS — Z7722 Contact with and (suspected) exposure to environmental tobacco smoke (acute) (chronic): Secondary | ICD-10-CM | POA: Diagnosis not present

## 2019-10-10 DIAGNOSIS — F1210 Cannabis abuse, uncomplicated: Secondary | ICD-10-CM | POA: Insufficient documentation

## 2019-10-10 DIAGNOSIS — R251 Tremor, unspecified: Secondary | ICD-10-CM | POA: Insufficient documentation

## 2019-10-10 DIAGNOSIS — R079 Chest pain, unspecified: Secondary | ICD-10-CM

## 2019-10-10 DIAGNOSIS — I10 Essential (primary) hypertension: Secondary | ICD-10-CM | POA: Insufficient documentation

## 2019-10-10 LAB — BASIC METABOLIC PANEL
Anion gap: 8 (ref 5–15)
BUN: 11 mg/dL (ref 6–20)
CO2: 27 mmol/L (ref 22–32)
Calcium: 9.5 mg/dL (ref 8.9–10.3)
Chloride: 106 mmol/L (ref 98–111)
Creatinine, Ser: 1.08 mg/dL (ref 0.61–1.24)
GFR calc Af Amer: >60 mL/min (ref >60)
GFR calc non Af Amer: >60 mL/min (ref >60)
Glucose, Bld: 97 mg/dL (ref 70–99)
Potassium: 4 mmol/L (ref 3.5–5.1)
Sodium: 141 mmol/L (ref 135–145)

## 2019-10-10 LAB — CBC
HCT: 46.1 % (ref 39.0–52.0)
Hemoglobin: 14.7 g/dL (ref 13.0–17.0)
MCH: 28.8 pg (ref 26.0–34.0)
MCHC: 31.9 g/dL (ref 30.0–36.0)
MCV: 90.4 fL (ref 80.0–100.0)
Platelets: 283 10*3/uL (ref 150–400)
RBC: 5.1 MIL/uL (ref 4.22–5.81)
RDW: 11.9 % (ref 11.5–15.5)
WBC: 2.7 10*3/uL — ABNORMAL LOW (ref 4.0–10.5)
nRBC: 0 % (ref 0.0–0.2)

## 2019-10-10 LAB — TROPONIN I (HIGH SENSITIVITY): Troponin I (High Sensitivity): <2 ng/L (ref <18)

## 2019-10-10 IMAGING — CR DG CHEST 2V
2 series · 2 of 2 positions shown · non-contrast
Comparison: Chest radiograph [DATE]

CLINICAL DATA: Patient c/o intermittent mid chest pain x 1 month.
[DATE] months.

EXAM:
CHEST - 2 VIEW

[w chest pa]
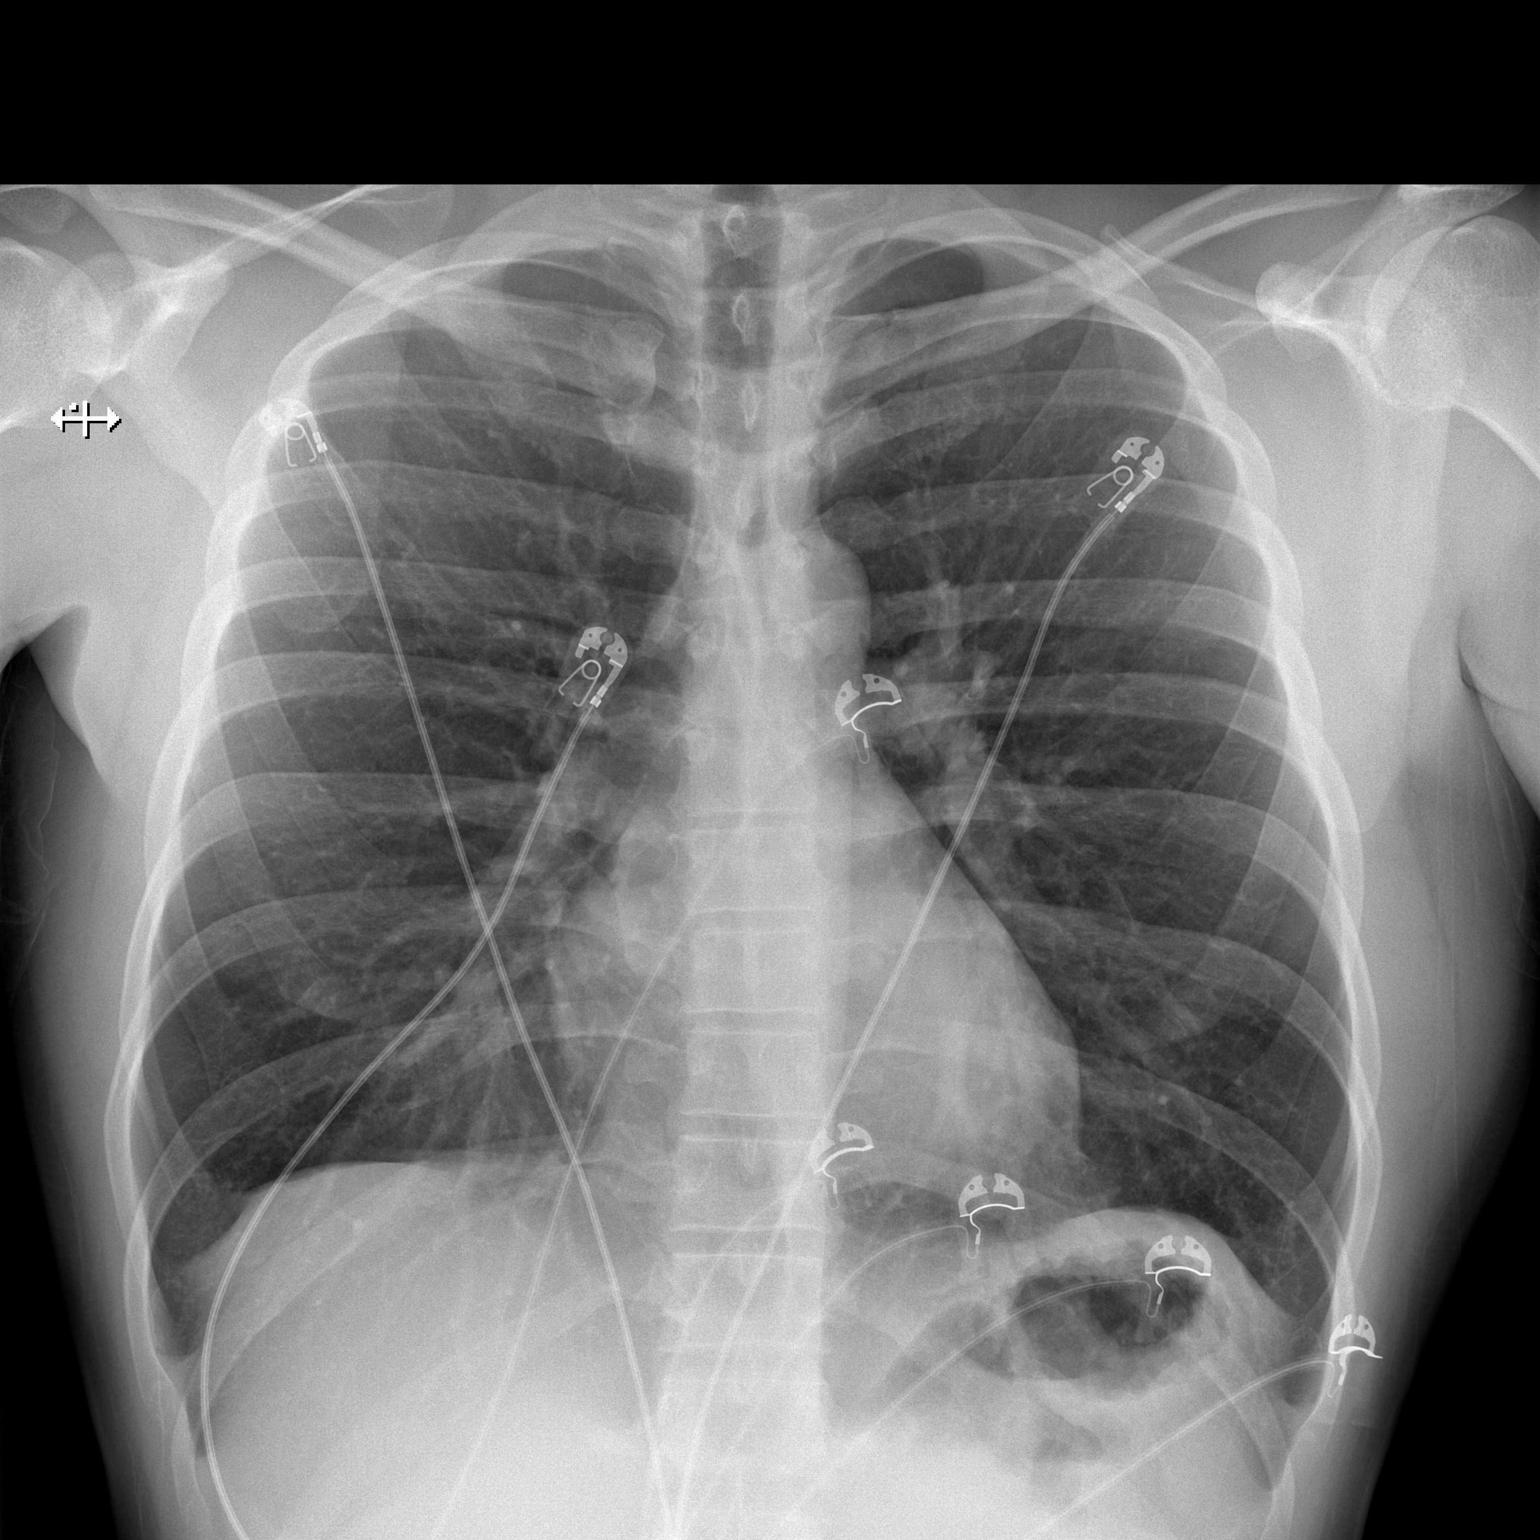

[w chest lat]
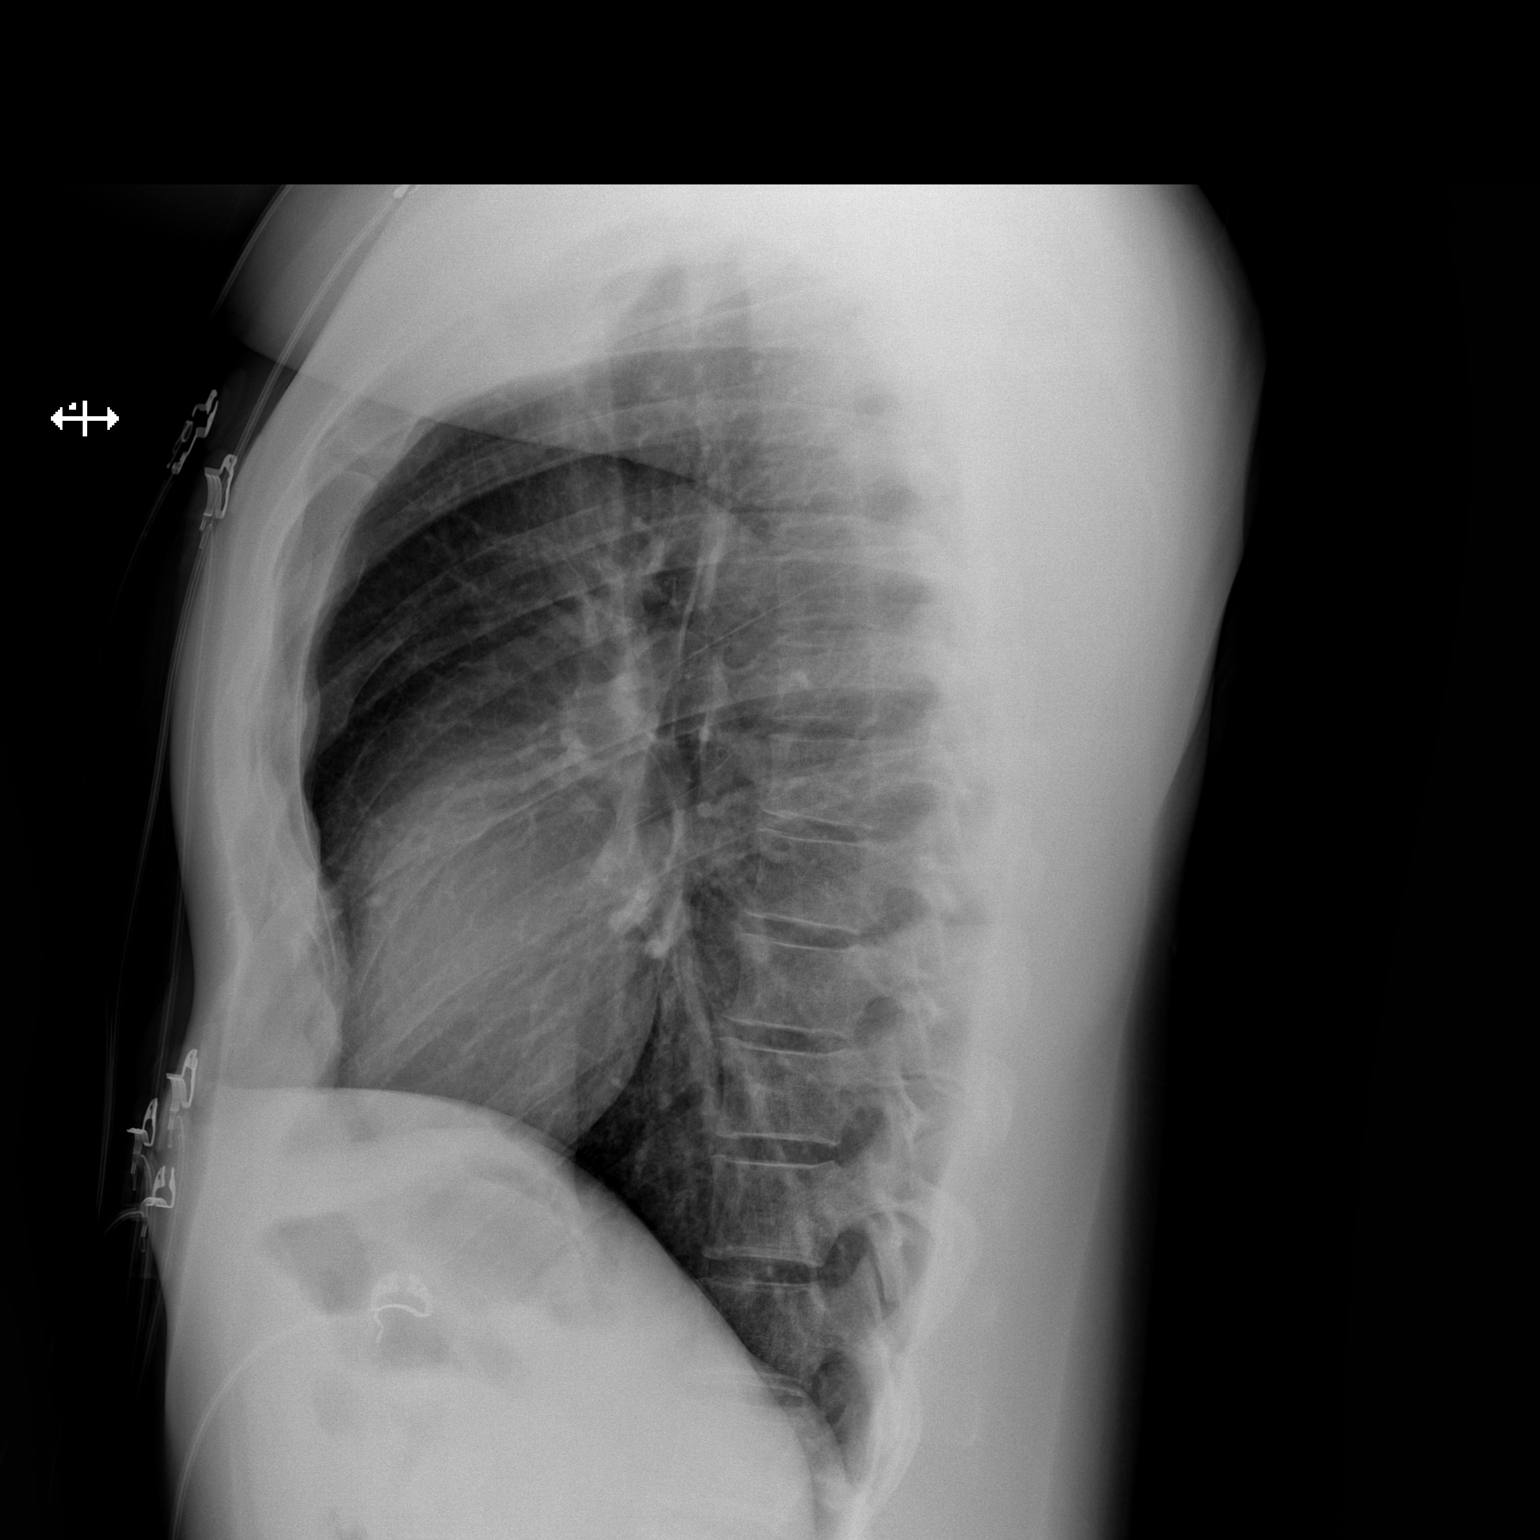

[2 of 2 positions shown; findings below may reference images not displayed]

FINDINGS: The heart size and mediastinal contours are within normal limits.
The lungs are clear. No pneumothorax or pleural effusion. The
visualized skeletal structures are unremarkable.
IMPRESSION: No active cardiopulmonary disease.

## 2019-10-10 MED ORDER — SODIUM CHLORIDE 0.9% FLUSH
3.0000 mL | Freq: Once | INTRAVENOUS | Status: AC
  Administered 2019-10-10: 3 mL via INTRAVENOUS

## 2019-10-10 NOTE — Discharge Instructions (Addendum)
Your work-up was reassuring today regarding your chest pain and your neurologic exam is normal.  Please continue following up with your primary care doctor is also very important that you call to schedule follow-up appointment with neurology.  Return to the emergency department if you develop any new or concerning symptoms.  Your white blood cell count was a little low today, please have this rechecked by your PCP. Sometimes this is seen with COVID.  You have a Covid test pending and you should quarantine at home until you receive your results, these should be back in about 2 days, you will receive a phone call if results are positive, you can view negative results online through MyChart.  Please follow the instructions on your paperwork today to set up your MyChart account.

## 2019-10-10 NOTE — ED Provider Notes (Signed)
[facility] DEPT Provider Note   CSN: [id number] Arrival date & time: 10/10/19  1513     History Chief Complaint  Patient presents with  . Chest Pain  . Tremors    Berlyn Malina is a 29 y.o. male.  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  He states that over the past month he has had intermittent pains in the center of his chest that come and go.  He reports that last week they seem to have gone away and they started to come back.  He reports today they seem to be improving and he denies any current chest pain.  Pain is nonradiating.  Is not associated with shortness of breath, lightheadedness or syncope.  He denies any associated fevers or cough.  No associated abdominal pain, nausea vomiting or diarrhea.  When he was seen in the ED at the end of January he had reassuring work-up regarding chest pain.  He also reports that he continues to have tremors in his sleep.  He describes these as a jerking motion of one of his extremities or torso as that wakes him from his sleep.  He does not have persistent or continued jerking he states that his 1 motion.  He states that these have been decreased recently over the past 3 nights he has only gotten about 6 hours of sleep in total due to frequent jerking motions that wake him up and then he has difficulty getting back to sleep.  He does state he has had increasing anxiety because the symptoms continue, he recently establish care with a PCP who started him on anxiety medication he reports minimal improvement.  He was seen for these sleep tremors by neurology on 1/26 had MRI imaging of the brain, and entire spine that was unremarkable.  He also had normal EEG and was discharged with plans for outpatient neurology follow-up, he has not followed up  with neurology yet.  Aside from increasing frequency he denies any changes or new symptoms.  No associated headache, vision changes, numbness, weakness or tingling.  No dizziness or balance changes.  No new medication changes.  He denies drug or alcohol use.  No other aggravating or alleviating factors.        Past Medical History:  Diagnosis Date  . Allergy   . Anxiety   . Depression   . Hypertension     Patient Active Problem List   Diagnosis Date Noted  . Hypertension, essential, benign 09/22/2019  . Gastroesophageal reflux disease 09/22/2019  . Major depressive disorder, single episode, severe ([place]) 09/22/2019  . Anxiety disorder 09/22/2019  . 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 MDD (major depressive disorder) 09/02/2013  . Cannabis abuse 09/02/2013  . Suicide attempt by substance overdose ([place]) 09/02/2013    History reviewed. No pertinent surgical history.     Family History  Problem Relation Age of Onset  . Hypertension Mother   . Diabetes Father     Social History   Tobacco Use  . Smoking status: Passive Smoke Exposure - Never Smoker  . Smokeless tobacco: Never Used  Substance Use Topics  . Alcohol use: Yes    Comment: rare  . Drug use: Yes    Types: Marijuana    Home Medications Prior to Admission medications   Medication Sig Start Date End Date Taking? Authorizing  Provider  amLODipine (NORVASC) 5 MG tablet Take 1 tablet (5 mg total) by mouth daily. 09/22/19  Yes [place], Betty G, MD  hydrOXYzine (ATARAX/VISTARIL) 25 MG tablet Take 1 tablet (25 mg total) by mouth 3 (three) times daily as needed for itching. 09/22/19  Yes [place], Betty G, MD  Melatonin 1 MG CHEW Chew 1 tablet by mouth at bedtime as needed (sleep).   Yes [provider]  naproxen sodium (ALEVE) 220 MG tablet Take 440 mg by mouth daily as needed (chest pain).   Yes [provider]  omeprazole (PRILOSEC) 40 MG capsule Take 1 capsule (40 mg total) by mouth daily. 09/22/19  Yes [place],  Betty G, MD    Allergies    Patient has no known allergies.  Review of Systems   Review of Systems  Constitutional: Negative for chills and fever.  HENT: Negative.   Eyes: Negative for visual disturbance.  Respiratory: Negative for cough and shortness of breath.   Cardiovascular: Positive for chest pain. Negative for leg swelling.  Gastrointestinal: Negative for abdominal pain, nausea and vomiting.  Genitourinary: Negative for dysuria and frequency.  Musculoskeletal: Negative for arthralgias and myalgias.  Skin: Negative for color change and rash.  Neurological: Positive for tremors. Negative for dizziness, seizures, syncope, facial asymmetry, speech difficulty, weakness, light-headedness, numbness and headaches.  Psychiatric/Behavioral: The patient is nervous/anxious.     Physical Exam Updated Vital Signs BP (!) 145/89 (BP Location: Right Arm)   Pulse 93   Temp 98.3 F (36.8 C) (Oral)   Resp 14   Ht 5' 8.25" (1.734 m)   Wt 83.9 kg   SpO2 100%   BMI 27.92 kg/m   Physical Exam Vitals and nursing note reviewed.  Constitutional:      General: He is not in acute distress.    Appearance: He is well-developed and normal weight. He is not diaphoretic.  HENT:     Head: Normocephalic and atraumatic.  Eyes:     General:        Right eye: No discharge.        Left eye: No discharge.     Pupils: Pupils are equal, round, and reactive to light.  Cardiovascular:     Rate and Rhythm: Normal rate and regular rhythm.     Pulses:          Radial pulses are 2+ on the right side and 2+ on the left side.       Dorsalis pedis pulses are 2+ on the right side and 2+ on the left side.     Heart sounds: Normal heart sounds.  Pulmonary:     Effort: Pulmonary effort is normal. No respiratory distress.     Breath sounds: Normal breath sounds. No wheezing or rales.     Comments: Respirations equal and unlabored, patient able to speak in full sentences, lungs clear to auscultation  bilaterally Chest:     Chest wall: No tenderness.  Abdominal:     General: Bowel sounds are normal. There is no distension.     Palpations: Abdomen is soft. There is no mass.     Tenderness: There is no abdominal tenderness. There is no guarding.     Comments: Abdomen soft, nondistended, nontender to palpation in all quadrants without guarding or peritoneal signs  Musculoskeletal:        General: No deformity.     Cervical back: Neck supple.     Right lower leg: No tenderness. No edema.  Left lower leg: No tenderness. No edema.  Skin:    General: Skin is warm and dry.     Capillary Refill: Capillary refill takes less than 2 seconds.  Neurological:     Mental Status: He is alert.     Coordination: Coordination normal.     Comments: Speech is clear, able to follow commands CN III-XII intact Normal strength in upper and lower extremities bilaterally including dorsiflexion and plantar flexion, strong and equal grip strength 2+ DTRs in bilateral lower extremities, no clonus noted Sensation normal to light and sharp touch Moves extremities without ataxia, coordination intact Normal finger to nose and rapid alternating movements No pronator drift  Psychiatric:        Mood and Affect: Mood normal.        Behavior: Behavior normal.     ED Results / Procedures / Treatments   Labs (all labs ordered are listed, but only abnormal results are displayed) Labs Reviewed  CBC - Abnormal; Notable for the following components:      Result Value   WBC 2.7 (*)    All other components within normal limits  SARS CORONAVIRUS 2 (TAT 6-24 HRS)  BASIC METABOLIC PANEL  TROPONIN I (HIGH SENSITIVITY)    EKG EKG Interpretation  Date/Time:  Saturday October 10 2019 15:22:33 EST Ventricular Rate:  82 PR Interval:    QRS Duration: 101 QT Interval:  348 QTC Calculation: 407 R Axis:   87 Text Interpretation: Sinus rhythm RSR' in V1 or V2, right VCD or RVH ST elev, probable normal early repol  pattern No significant change since last tracing Confirmed by Richardean Canal [phone]) on 10/10/2019 4:31:32 PM   Radiology DG Chest 2 View  Result Date: 10/10/2019 CLINICAL DATA:  Patient c/o intermittent mid chest pain x 1 month. Patient also reported that he has been having sleep tremors for 1 1/2 months. EXAM: CHEST - 2 VIEW COMPARISON:  Chest radiograph 08/31/2019 FINDINGS: The heart size and mediastinal contours are within normal limits. The lungs are clear. No pneumothorax or pleural effusion. The visualized skeletal structures are unremarkable. IMPRESSION: No active cardiopulmonary disease. Electronically Signed   By: Emmaline Kluver M.D.   On: 10/10/2019 15:41    Procedures Procedures (including critical care time)  Medications Ordered in ED Medications  sodium chloride flush (NS) 0.9 % injection 3 mL (3 mLs Intravenous Given 10/10/19 1556)    ED Course  I have reviewed the triage vital signs and the nursing notes.  Pertinent labs & imaging results that were available during my care of the patient were reviewed by me and considered in my medical decision making (see chart for details).    MDM Rules/Calculators/A&P                      29 year old male presents with 1 month of intermittent chest pains as well as continued sleep tremors.  Patient was evaluated by neurology and had MRIs of the brain and entire spine as well as a normal EEG.  He has not yet followed up outpatient with neurology.  On arrival today he has a normal neurologic exam, no tremors noted, no clonus, normal reflexes.  His chest pain sounds atypical to be ACS and is not concerning for PE or dissection.  He denies any associated fevers or cough.  Lab work is reassuring aside from new mild leukopenia with white count of 2.7, patient is not having any focal infectious symptoms and I do not feel  that further evaluation of this needed emergently, will send off Covid test to ensure patient is not an asymptomatic carrier.   Patient without any electrolyte derangements, normal renal function.  EKG without concerning changes and chest x-ray is clear.  I have stressed to him the importance of calling to schedule outpatient neurology follow-up as well as continuing to follow-up with his PCP.  Patient expresses understanding and agreement with plan.  Discharged home in good condition.  Final Clinical Impression(s) / ED Diagnoses Final diagnoses:  Central chest pain  Tremor  Leukopenia, unspecified type    Rx / DC Orders ED Discharge Orders    None       Legrand Rams 10/10/19 1723    Charlynne Pander, MD 10/11/19 [phone]

## 2019-10-10 NOTE — ED Triage Notes (Signed)
Patient c/o intermittent mid chest pain x 1 month. Patient denies any N/v or SOB.  Patient reports that he hs been having Sleep tremors for 1 1/2 months. Patient states, " I have not slept in 3 nights."

## 2019-10-11 LAB — SARS CORONAVIRUS 2 (TAT 6-24 HRS): SARS Coronavirus 2: NEGATIVE

## 2019-10-14 ENCOUNTER — Other Ambulatory Visit: Payer: Self-pay

## 2019-10-14 ENCOUNTER — Ambulatory Visit (INDEPENDENT_AMBULATORY_CARE_PROVIDER_SITE_OTHER): Payer: Managed Care, Other (non HMO) | Admitting: Neurology

## 2019-10-14 ENCOUNTER — Encounter: Payer: Self-pay | Admitting: Neurology

## 2019-10-14 VITALS — BP 126/75 | HR 67 | Temp 97.5°F | Ht 68.25 in | Wt 190.5 lb

## 2019-10-14 DIAGNOSIS — G253 Myoclonus: Secondary | ICD-10-CM | POA: Diagnosis not present

## 2019-10-14 MED ORDER — TRAZODONE HCL 50 MG PO TABS: 50.0000 mg | ORAL_TABLET | Freq: Every day | ORAL | 5 refills | Status: DC

## 2019-10-14 NOTE — Progress Notes (Signed)
PATIENT: Eugene Sanders DOB: 05/13/1991  Chief Complaint  Patient presents with  . Myoclonus    Reports jerking motions in his extremities and torso that wake him from sleep at night.  Symptoms started in January 2021.   Marland Kitchen PCP    Martinique, Betty G, MD (referred from hospital)     HISTORICAL  Eugene Sanders is a 29 year old male, seen in request by his primary care physician Dr. [place], Betty G, for evaluation of myoclonic jerking movement before falling to sleep, initial evaluation was on October 14, 2019.  I have reviewed and summarized the referring note from the referring physician.  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  Laboratory evaluations in 2021, negative troponin, CBC was decreased 2.7, BMP was normal, A1c was 5.3, B12 243, CPK 238   REVIEW OF SYSTEMS: Full 14 system review of systems performed and notable only for as above All other review of systems were negative.  ALLERGIES: No Known Allergies  HOME MEDICATIONS: Current Outpatient Medications  Medication Sig Dispense Refill  . amLODipine (NORVASC) 5 MG tablet Take 1 tablet (5 mg total) by mouth daily. 90 tablet 1  . hydrOXYzine (ATARAX/VISTARIL) 25 MG tablet Take 1 tablet (25 mg total) by mouth 3 (three) times daily as needed for itching.  30 tablet 1  . Melatonin 1 MG CHEW Chew 1 tablet by mouth at bedtime as needed (sleep).    . naproxen sodium (ALEVE) 220 MG tablet Take 440 mg by mouth daily as needed (chest pain).    Marland Kitchen omeprazole (PRILOSEC) 40 MG capsule Take 1 capsule (40 mg total) by mouth daily. 30 capsule 2   No current facility-administered medications for this visit.    PAST MEDICAL HISTORY: Past Medical History:  Diagnosis Date  . Acid reflux   . Allergy   . Anxiety   . Depression   . Hypertension   . Myoclonus     PAST SURGICAL HISTORY: Past Surgical History:  Procedure Laterality Date  . no past surgery      FAMILY HISTORY: Family History  Problem Relation Age of Onset  . Hypertension Mother   . Diabetes Father     SOCIAL HISTORY: Social History   Socioeconomic History  . Marital status: Single    Spouse name: Not on file  . Number of children: 0  . Years of education: college  . Highest education level: Associate degree: academic program  Occupational History  . Occupation: Designer, jewellery  Tobacco Use  . Smoking status: Never Smoker  . Smokeless tobacco: Never Used  Substance and Sexual Activity  . Alcohol use: Yes    Comment: rare  . Drug use: Yes    Types: Marijuana    Comment: rare  . Sexual activity: Not on file  Other Topics Concern  . Not on file  Social History Narrative   Lives  at [facility] with his mother.   Right-handed.   No daily caffeine use.   Social Determinants of Health   Financial Resource Strain:   . Difficulty of Paying Living Expenses: Not on file  Food Insecurity:   . Worried About Programme researcher, broadcasting/film/video in the Last Year: Not on file  . Ran Out of Food in the Last Year: Not on file  Transportation Needs:   . Lack of Transportation (Medical): Not on file  . Lack of Transportation (Non-Medical): Not on file  Physical Activity:   . Days of Exercise per Week: Not on file  . Minutes of Exercise per Session: Not on file  Stress:   . Feeling of Stress : Not  on file  Social Connections:   . Frequency of Communication with Friends and Family: Not on file  . Frequency of Social Gatherings with Friends and Family: Not on file  . Attends Religious Services: Not on file  . Active Member of Clubs or Organizations: Not on file  . Attends Banker Meetings: Not on file  . Marital Status: Not on file  Intimate Partner Violence:   . Fear of Current or Ex-Partner: Not on file  . Emotionally Abused: Not on file  . Physically Abused: Not on file  . Sexually Abused: Not on file     PHYSICAL EXAM   Vitals:   10/14/19 0819  BP: 126/75  Pulse: 67  Temp: (!) 97.5 F (36.4 C)  Weight: 190 lb 8 oz (86.4 kg)  Height: 5' 8.25" (1.734 m)    Not recorded      Body mass index is 28.75 kg/m.  PHYSICAL EXAMNIATION:  Gen: NAD, conversant, well nourised, well groomed                     Cardiovascular: Regular rate rhythm, no peripheral edema, warm, nontender. Eyes: Conjunctivae clear without exudates or hemorrhage Neck: Supple, no carotid bruits. Pulmonary: Clear to auscultation bilaterally   NEUROLOGICAL EXAM:  MENTAL STATUS: Speech:    Speech is normal; fluent and spontaneous with normal comprehension.  Cognition:     Orientation to time, place and person     Normal recent and remote memory     Normal Attention span and concentration     Normal Language, naming, repeating,spontaneous speech     Fund of knowledge   CRANIAL NERVES: CN II: Visual fields are full to confrontation. Pupils are round equal and briskly reactive to light. CN III, IV, VI: extraocular movement are normal. No ptosis. CN V: Facial sensation is intact to light touch CN VII: Face is symmetric with normal eye closure  CN VIII: Hearing is normal to causal conversation. CN IX, X: Phonation is normal. CN XI: Head turning and shoulder shrug are intact  MOTOR: There is no pronator drift of out-stretched arms. Muscle bulk and tone are normal. Muscle strength  is normal.  REFLEXES: Reflexes are 2+ and symmetric at the biceps, triceps, knees, and ankles. Plantar responses are flexor.  SENSORY: Intact to light touch, pinprick and vibratory sensation are intact in fingers and toes.  COORDINATION: There is no trunk or limb dysmetria noted.  GAIT/STANCE: Posture is normal. Gait is steady with normal steps, base, arm swing, and turning. Heel and toe walking are normal. Tandem gait is normal.  Romberg is absent.   DIAGNOSTIC DATA (LABS, IMAGING, TESTING) - I reviewed patient records, labs, notes, testing and imaging myself where available.   ASSESSMENT AND PLAN  Noland Pizano Craghead is a 29 y.o. male   Myoclonic jerking  In the setting of anxiety, only related to sleep, encouraged to continue melatonin, will also try trazodone 50 mg daily  EEG Decreased WBC  Repeat CBC with differentiation    Levert Feinstein, M.D. Ph.D.  [facility] [address], [address] [place], [place] [address] Ph: [phone] Fax: [phone]  CC: [place], Betty G, MD

## 2019-10-15 LAB — CBC WITH DIFFERENTIAL/PLATELET
Basophils Absolute: 0 10*3/uL (ref 0.0–0.2)
Basos: 1 %
EOS (ABSOLUTE): 0.2 10*3/uL (ref 0.0–0.4)
Eos: 4 %
Hematocrit: 41 % (ref 37.5–51.0)
Hemoglobin: 13.5 g/dL (ref 13.0–17.7)
Immature Grans (Abs): 0 10*3/uL (ref 0.0–0.1)
Immature Granulocytes: 0 %
Lymphocytes Absolute: 2.6 10*3/uL (ref 0.7–3.1)
Lymphs: 49 %
MCH: 29.2 pg (ref 26.6–33.0)
MCHC: 32.9 g/dL (ref 31.5–35.7)
MCV: 89 fL (ref 79–97)
Monocytes Absolute: 0.7 10*3/uL (ref 0.1–0.9)
Monocytes: 12 %
Neutrophils Absolute: 1.8 10*3/uL (ref 1.4–7.0)
Neutrophils: 34 %
Platelets: 278 10*3/uL (ref 150–450)
RBC: 4.63 x10E6/uL (ref 4.14–5.80)
RDW: 11.8 % (ref 11.6–15.4)
WBC: 5.3 10*3/uL (ref 3.4–10.8)

## 2019-10-30 ENCOUNTER — Telehealth: Payer: Self-pay | Admitting: Neurology

## 2019-10-30 NOTE — Telephone Encounter (Signed)
Left patient a detailed message, with results, on voicemail (ok per DPR).  Provided our number to call back with any questions.  

## 2019-10-30 NOTE — Telephone Encounter (Signed)
Pt called wanting to know when his blood work results will be in. Please advise.

## 2019-11-04 ENCOUNTER — Other Ambulatory Visit: Payer: Managed Care, Other (non HMO)

## 2019-11-18 ENCOUNTER — Other Ambulatory Visit: Payer: Managed Care, Other (non HMO)

## 2019-11-23 ENCOUNTER — Ambulatory Visit: Payer: Managed Care, Other (non HMO) | Admitting: Family Medicine

## 2019-12-24 ENCOUNTER — Other Ambulatory Visit: Payer: Self-pay | Admitting: Family Medicine

## 2019-12-24 DIAGNOSIS — K219 Gastro-esophageal reflux disease without esophagitis: Secondary | ICD-10-CM

## 2021-03-26 ENCOUNTER — Emergency Department (HOSPITAL_COMMUNITY)
Admission: EM | Admit: 2021-03-26 | Discharge: 2021-03-26 | Disposition: A | Discharge status: Home / Self Care | Payer: Managed Care, Other (non HMO) | Attending: Emergency Medicine | Admitting: Emergency Medicine

## 2021-03-26 ENCOUNTER — Other Ambulatory Visit: Payer: Self-pay

## 2021-03-26 ENCOUNTER — Encounter (HOSPITAL_COMMUNITY): Payer: Self-pay | Admitting: Oncology

## 2021-03-26 DIAGNOSIS — I10 Essential (primary) hypertension: Secondary | ICD-10-CM | POA: Diagnosis not present

## 2021-03-26 DIAGNOSIS — Z79899 Other long term (current) drug therapy: Secondary | ICD-10-CM | POA: Diagnosis not present

## 2021-03-26 DIAGNOSIS — R1011 Right upper quadrant pain: Secondary | ICD-10-CM

## 2021-03-26 DIAGNOSIS — R109 Unspecified abdominal pain: Secondary | ICD-10-CM | POA: Diagnosis present

## 2021-03-26 DIAGNOSIS — K219 Gastro-esophageal reflux disease without esophagitis: Secondary | ICD-10-CM | POA: Diagnosis not present

## 2021-03-26 LAB — URINALYSIS, ROUTINE W REFLEX MICROSCOPIC
Bacteria, UA: NONE SEEN
Bilirubin Urine: NEGATIVE
Glucose, UA: NEGATIVE mg/dL
Ketones, ur: NEGATIVE mg/dL
Leukocytes,Ua: NEGATIVE
Nitrite: NEGATIVE
Protein, ur: NEGATIVE mg/dL
Specific Gravity, Urine: 1.005 (ref 1.005–1.030)
pH: 8 (ref 5.0–8.0)

## 2021-03-26 LAB — COMPREHENSIVE METABOLIC PANEL
ALT: 22 U/L (ref 0–44)
AST: 30 U/L (ref 15–41)
Albumin: 4.6 g/dL (ref 3.5–5.0)
Alkaline Phosphatase: 33 U/L — ABNORMAL LOW (ref 38–126)
Anion gap: 5 (ref 5–15)
BUN: 11 mg/dL (ref 6–20)
CO2: 28 mmol/L (ref 22–32)
Calcium: 9.5 mg/dL (ref 8.9–10.3)
Chloride: 106 mmol/L (ref 98–111)
Creatinine, Ser: 1 mg/dL (ref 0.61–1.24)
GFR, Estimated: >60 mL/min (ref >60)
Glucose, Bld: 86 mg/dL (ref 70–99)
Potassium: 4.2 mmol/L (ref 3.5–5.1)
Sodium: 139 mmol/L (ref 135–145)
Total Bilirubin: 0.4 mg/dL (ref 0.3–1.2)
Total Protein: 7.2 g/dL (ref 6.5–8.1)

## 2021-03-26 LAB — CBC
HCT: 47.7 % (ref 39.0–52.0)
Hemoglobin: 15.5 g/dL (ref 13.0–17.0)
MCH: 29.4 pg (ref 26.0–34.0)
MCHC: 32.5 g/dL (ref 30.0–36.0)
MCV: 90.3 fL (ref 80.0–100.0)
Platelets: 259 10*3/uL (ref 150–400)
RBC: 5.28 MIL/uL (ref 4.22–5.81)
RDW: 11.9 % (ref 11.5–15.5)
WBC: 3 10*3/uL — ABNORMAL LOW (ref 4.0–10.5)
nRBC: 0 % (ref 0.0–0.2)

## 2021-03-26 LAB — LIPASE, BLOOD: Lipase: 27 U/L (ref 11–51)

## 2021-03-26 MED ORDER — ACETAMINOPHEN 500 MG PO TABS
1000.0000 mg | ORAL_TABLET | Freq: Once | ORAL | Status: AC
  Administered 2021-03-26: 1000 mg via ORAL
  Filled 2021-03-26: qty 2

## 2021-03-26 MED ORDER — ALUM & MAG HYDROXIDE-SIMETH 200-200-20 MG/5ML PO SUSP
30.0000 mL | Freq: Once | ORAL | Status: AC
  Administered 2021-03-26: 30 mL via ORAL
  Filled 2021-03-26: qty 30

## 2021-03-26 MED ORDER — FAMOTIDINE 20 MG PO TABS
20.0000 mg | ORAL_TABLET | Freq: Once | ORAL | Status: AC
  Administered 2021-03-26: 20 mg via ORAL
  Filled 2021-03-26: qty 1

## 2021-03-26 NOTE — ED Notes (Signed)
Dr. Denton Lank at bedside assessing pt and updating pt on plan of care at this time.

## 2021-03-26 NOTE — ED Triage Notes (Signed)
Pt reports month hx of RUQ abdominal pain.

## 2021-03-26 NOTE — Discharge Instructions (Signed)
It was our pleasure to provide your ER care today - we hope that you feel better.  Today, your lab tests look good/normal.   Minimize alcohol and thc use as both can cause upper abdominal pain.   If gi symptoms, try pepcid and maalox for symptom relief.   Follow up with primary care doctor in the next 1-2 weeks - discuss possible additional evaluation and testing if symptoms persist.  Return to ER if worse, new symptoms, fevers, worsening or severe pain, persistent vomiting, or other concern.

## 2021-03-26 NOTE — ED Provider Notes (Signed)
[facility]-EMERGENCY DEPT Provider Note   CSN: [id number] Arrival date & time: 03/26/21  1135     History Chief Complaint  Patient presents with  . Abdominal Pain    Eugene Sanders is a 30 y.o. male.  Patient c/o intermittently ruq pain in past month. Symptoms occur at rest, acute onset, episodic, last seconds to minutes, dull, non radiating. No consistent exacerbating or alleviating factors. Normal appetite. No wt loss. No associated nausea/vomiting. Having normal bms. No hx pud, pancreatitis or gallstones. No hx kidney stones. No dysuria or hematuria. No back pain. No chest pain or sob. No cough or uri symptoms. No fever or chills. No acute or abrupt worsening today.   The history is provided by the patient.  Abdominal Pain Associated symptoms: no chest pain, no cough, no diarrhea, no dysuria, no fever, no hematuria, no shortness of breath, no sore throat and no vomiting       Past Medical History:  Diagnosis Date  . Acid reflux   . Allergy   . Anxiety   . Depression   . Hypertension   . Myoclonus     Patient Active Problem List   Diagnosis Date Noted  . 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 Hypertension, essential, benign 09/22/2019  . Gastroesophageal reflux disease 09/22/2019  . Major depressive disorder, single episode, severe (HCC) 09/22/2019  . Anxiety disorder 09/22/2019  . 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 MDD (major depressive disorder) 09/02/2013  . Cannabis abuse 09/02/2013  . Suicide attempt by substance overdose (HCC) 09/02/2013    Past Surgical History:  Procedure Laterality Date  . no past surgery         Family History  Problem Relation Age of Onset  . Hypertension Mother   . Diabetes Father     Social History   Tobacco Use  . Smoking status: Never  . Smokeless tobacco: Never  Vaping Use  . Vaping Use: Never used  Substance Use Topics  . Alcohol use: Yes    Comment: rare  . Drug use: Yes    Types:  Marijuana    Comment: rare    Home Medications Prior to Admission medications   Medication Sig Start Date End Date Taking? Authorizing Provider  amLODipine (NORVASC) 5 MG tablet Take 1 tablet (5 mg total) by mouth daily. 09/22/19   [place], Betty G, MD  hydrOXYzine (ATARAX/VISTARIL) 25 MG tablet Take 1 tablet (25 mg total) by mouth 3 (three) times daily as needed for itching. 09/22/19   [place], Betty G, MD  Melatonin 1 MG CHEW Chew 1 tablet by mouth at bedtime as needed (sleep).    [provider]  naproxen sodium (ALEVE) 220 MG tablet Take 440 mg by mouth daily as needed (chest pain).    [provider]  omeprazole (PRILOSEC) 40 MG capsule TAKE 1 CAPSULE(40 MG) BY MOUTH DAILY 12/24/19   [place], Betty G, MD  traZODone (DESYREL) 50 MG tablet Take 1 tablet (50 mg total) by mouth at bedtime. 10/14/19   Levert Feinstein, MD    Allergies    Patient has no known allergies.  Review of Systems   Review of Systems  Constitutional:  Negative for fever.  HENT:  Negative for sore throat.   Eyes:  Negative for redness.  Respiratory:  Negative for cough and shortness of breath.   Cardiovascular:  Negative for chest pain.  Gastrointestinal:  Positive for abdominal pain. Negative for diarrhea and vomiting.  Genitourinary:  Negative for dysuria, flank  pain and hematuria.  Musculoskeletal:  Negative for back pain and neck pain.  Skin:  Negative for rash.  Neurological:  Negative for headaches.  Hematological:  Does not bruise/bleed easily.  Psychiatric/Behavioral:  Negative for confusion.    Physical Exam Updated Vital Signs BP (!) 146/86 (BP Location: Right Arm)   Pulse 72   Temp 98 F (36.7 C) (Oral)   Resp 16   Ht 1.829 m (6')   Wt 85.3 kg   SpO2 100%   BMI 25.50 kg/m   Physical Exam Vitals and nursing note reviewed.  Constitutional:      Appearance: Normal appearance. He is well-developed.  HENT:     Head: Atraumatic.     Nose: Nose normal.     Mouth/Throat:     Mouth:  Mucous membranes are moist.     Pharynx: Oropharynx is clear.  Eyes:     General: No scleral icterus.    Conjunctiva/sclera: Conjunctivae normal.  Neck:     Trachea: No tracheal deviation.  Cardiovascular:     Rate and Rhythm: Normal rate and regular rhythm.     Pulses: Normal pulses.     Heart sounds: Normal heart sounds. No murmur heard.   No friction rub. No gallop.  Pulmonary:     Effort: Pulmonary effort is normal. No accessory muscle usage or respiratory distress.     Breath sounds: Normal breath sounds.  Abdominal:     General: Bowel sounds are normal. There is no distension.     Palpations: Abdomen is soft. There is no mass.     Tenderness: There is no abdominal tenderness. There is no guarding or rebound.     Hernia: No hernia is present.  Genitourinary:    Comments: No cva tenderness. Musculoskeletal:        General: No swelling.     Cervical back: Normal range of motion and neck supple. No rigidity.  Skin:    General: Skin is warm and dry.     Findings: No rash.  Neurological:     Mental Status: He is alert.     Comments: Alert, speech clear.   Psychiatric:        Mood and Affect: Mood normal.    ED Results / Procedures / Treatments   Labs (all labs ordered are listed, but only abnormal results are displayed) Results for orders placed or performed during the hospital encounter of 03/26/21  Lipase, blood  Result Value Ref Range   Lipase 27 11 - 51 U/L  Comprehensive metabolic panel  Result Value Ref Range   Sodium 139 135 - 145 mmol/L   Potassium 4.2 3.5 - 5.1 mmol/L   Chloride 106 98 - 111 mmol/L   CO2 28 22 - 32 mmol/L   Glucose, Bld 86 70 - 99 mg/dL   BUN 11 6 - 20 mg/dL   Creatinine, Ser 4.09 0.61 - 1.24 mg/dL   Calcium 9.5 8.9 - 81.1 mg/dL   Total Protein 7.2 6.5 - 8.1 g/dL   Albumin 4.6 3.5 - 5.0 g/dL   AST 30 15 - 41 U/L   ALT 22 0 - 44 U/L   Alkaline Phosphatase 33 (L) 38 - 126 U/L   Total Bilirubin 0.4 0.3 - 1.2 mg/dL   GFR, Estimated >91  >47 mL/min   Anion gap 5 5 - 15  CBC  Result Value Ref Range   WBC 3.0 (L) 4.0 - 10.5 K/uL   RBC 5.28 4.22 - 5.81 MIL/uL  Hemoglobin 15.5 13.0 - 17.0 g/dL   HCT 66.4 40.3 - 47.4 %   MCV 90.3 80.0 - 100.0 fL   MCH 29.4 26.0 - 34.0 pg   MCHC 32.5 30.0 - 36.0 g/dL   RDW 25.9 56.3 - 87.5 %   Platelets 259 150 - 400 K/uL   nRBC 0.0 0.0 - 0.2 %  Urinalysis, Routine w reflex microscopic Urine, Clean Catch  Result Value Ref Range   Color, Urine STRAW (A) YELLOW   APPearance CLEAR CLEAR   Specific Gravity, Urine 1.005 1.005 - 1.030   pH 8.0 5.0 - 8.0   Glucose, UA NEGATIVE NEGATIVE mg/dL   Hgb urine dipstick SMALL (A) NEGATIVE   Bilirubin Urine NEGATIVE NEGATIVE   Ketones, ur NEGATIVE NEGATIVE mg/dL   Protein, ur NEGATIVE NEGATIVE mg/dL   Nitrite NEGATIVE NEGATIVE   Leukocytes,Ua NEGATIVE NEGATIVE   RBC / HPF 0-5 0 - 5 RBC/hpf   WBC, UA 0-5 0 - 5 WBC/hpf   Bacteria, UA NONE SEEN NONE SEEN     EKG None  Radiology No results found.  Procedures Procedures   Medications Ordered in ED Medications - No data to display  ED Course  I have reviewed the triage vital signs and the nursing notes.  Pertinent labs & imaging results that were available during my care of the patient were reviewed by me and considered in my medical decision making (see chart for details).    MDM Rules/Calculators/A&P                          Labs sent.   Reviewed nursing notes and prior charts for additional history.   Pepcid po, acetaminophen po, maalox po.   Labs reviewed/interpreted by me - lipase normal, hgb normal, wbc not elevated, ua negative.   Abd is soft and non tender. Pt afebrile. Symptoms present intermittently x 1 month without acute worsening.   Pt currently appears stable for d/c.   Rec pcp f/u - may continue outpatient workup if symptoms continue/persist.  Return precautions provided.    Final Clinical Impression(s) / ED Diagnoses Final diagnoses:  None    Rx / DC  Orders ED Discharge Orders     None        Cathren Laine, MD 03/26/21 1556

## 2021-04-11 ENCOUNTER — Other Ambulatory Visit: Payer: Self-pay

## 2021-04-12 ENCOUNTER — Ambulatory Visit (INDEPENDENT_AMBULATORY_CARE_PROVIDER_SITE_OTHER): Payer: Managed Care, Other (non HMO) | Admitting: Family Medicine

## 2021-04-12 ENCOUNTER — Encounter: Payer: Self-pay | Admitting: Family Medicine

## 2021-04-12 VITALS — BP 128/80 | HR 99 | Resp 16 | Ht 72.0 in | Wt 182.4 lb

## 2021-04-12 DIAGNOSIS — R7989 Other specified abnormal findings of blood chemistry: Secondary | ICD-10-CM | POA: Diagnosis not present

## 2021-04-12 DIAGNOSIS — K625 Hemorrhage of anus and rectum: Secondary | ICD-10-CM | POA: Diagnosis not present

## 2021-04-12 DIAGNOSIS — F419 Anxiety disorder, unspecified: Secondary | ICD-10-CM

## 2021-04-12 DIAGNOSIS — R1011 Right upper quadrant pain: Secondary | ICD-10-CM

## 2021-04-12 DIAGNOSIS — K219 Gastro-esophageal reflux disease without esophagitis: Secondary | ICD-10-CM | POA: Diagnosis not present

## 2021-04-12 LAB — C-REACTIVE PROTEIN: CRP: <1 mg/dL (ref 0.5–20.0)

## 2021-04-12 LAB — TSH: TSH: 1.35 u[IU]/mL (ref 0.35–5.50)

## 2021-04-12 LAB — SEDIMENTATION RATE: Sed Rate: 2 mm/hr (ref 0–15)

## 2021-04-12 MED ORDER — OMEPRAZOLE 40 MG PO CPDR: DELAYED_RELEASE_CAPSULE | ORAL | 1 refills | Status: DC

## 2021-04-12 NOTE — Progress Notes (Signed)
Chief Complaint  Patient presents with   Abdominal Pain    Acid reflux, started taking prilosec over the counter.    HPI: [name] is a 30 y.o. male, who is here today with above complaint. He was last seen on 09/22/19. Reporting that he was evaluated in urgen care for RUQ pain on 03/19/21,he had abnormal labs. A week later he went to the ED visit, labs repeat and normal and Dx'ed with GERD. RUQ pain is intermittent , sharp and dull, not radiated,and not related with food intake. Tylenol x 2 helped. He wonders if this is related to anxiety.  Abdominal Pain This is a new problem. The current episode started more than 1 month ago. The problem has been gradually improving. The pain is mild. Pertinent negatives include no belching, dysuria, fever, flatus, frequency, headaches, hematuria, melena, myalgias, nausea, vomiting or weight loss. He has tried antacids for the symptoms. The treatment provided mild relief. His past medical history is significant for GERD. There is no history of ulcerative colitis.  Heartburn He complains of abdominal pain and heartburn. He reports no belching, no chest pain, no choking, no coughing, no hoarse voice, no nausea, no sore throat or no wheezing. This is a recurrent problem. The problem occurs frequently. The heartburn is of mild intensity. The heartburn does not wake him from sleep. The heartburn does not limit his activity. The heartburn doesn't change with position. Pertinent negatives include no fatigue, melena or weight loss. Risk factors include ETOH use and NSAIDs. He has tried a PPI for the symptoms. Past procedures do not include an EGD.   He is on Omeprazole 20 mg daily for 3 days and has helped. Pepcid did not help. Heartburn exacerbated by food intake.  He had diarrhea and constipation a few weeks ago.  Blood with defecation on stool and on tissue. Negative for dyschezia/rectal pain.  Diarrhea intermittently for a while  aggravated by alcohol intake, resolved after stopping alcohol intake. Mylanta also caused diarrhea.  No hx of constipation or rectal bleed. FHx for IBD or colon cancer.  Lab Results  Component Value Date   WBC 3.0 (L) 03/26/2021   HGB 15.5 03/26/2021   HCT 47.7 03/26/2021   MCV 90.3 03/26/2021   PLT 259 03/26/2021   Lab Results  Component Value Date   ALT 22 03/26/2021   AST 30 03/26/2021   ALKPHOS 33 (L) 03/26/2021   BILITOT 0.4 03/26/2021   03/19/21: Lipase 96, neutrophils absolute 714,AST 41,ALT 19. GGT in normal range. Cr 1.1, e GFR 93.  Abnormal THS: Negative for cold/heat intolerance. Lab Results  Component Value Date   TSH 4.619 (H) 09/01/2019   HTN:He does not check BP regularly but is elevated with anxiety. He is not longer on Amlodipine.  Review of Systems  Constitutional:  Negative for activity change, appetite change, fatigue, fever and weight loss.  HENT:  Negative for hoarse voice, nosebleeds, sore throat and trouble swallowing.   Eyes:  Negative for redness and visual disturbance.  Respiratory:  Negative for cough, choking, shortness of breath and wheezing.   Cardiovascular:  Negative for chest pain, palpitations and leg swelling.  Gastrointestinal:  Positive for abdominal pain and heartburn. Negative for flatus, melena, nausea and vomiting.  Genitourinary:  Negative for decreased urine volume, dysuria, frequency and hematuria.  Musculoskeletal:  Negative for gait problem and myalgias.  Skin:  Negative for pallor and rash.  Neurological:  Negative for weakness and headaches.  Psychiatric/Behavioral:  Negative for confusion. The patient is nervous/anxious.   Rest see pertinent positives and negatives per HPI.  Current Outpatient Medications on File Prior to Visit  Medication Sig Dispense Refill   Melatonin 1 MG CHEW Chew 1 tablet by mouth at bedtime as needed (sleep).     naproxen sodium (ALEVE) 220 MG tablet Take 440 mg by mouth daily as needed (chest  pain).     No current facility-administered medications on file prior to visit.    Past Medical History:  Diagnosis Date   Acid reflux    Allergy    Anxiety    Depression    Hypertension    Myoclonus    No Known Allergies  Social History   Socioeconomic History   Marital status: Single    Spouse name: Not on file   Number of children: 0   Years of education: college   Highest education level: Associate degree: academic program  Occupational History   Occupation: Academic librarian  Tobacco Use   Smoking status: Never   Smokeless tobacco: Never  Vaping Use   Vaping Use: Never used  Substance and Sexual Activity   Alcohol use: Yes    Comment: rare   Drug use: Yes    Types: Marijuana    Comment: rare   Sexual activity: Not on file  Other Topics Concern   Not on file  Social History Narrative   Lives at [place] with his mother.   Right-handed.   No daily caffeine use.   Social Determinants of Health   Financial Resource Strain: Not on file  Food Insecurity: Not on file  Transportation Needs: Not on file  Physical Activity: Not on file  Stress: Not on file  Social Connections: Not on file    Vitals:   04/12/21 1000  BP: 128/80  Pulse: 99  Resp: 16  SpO2: 99%   Body mass index is 24.73 kg/m.  Physical Exam Nursing note reviewed.  Constitutional:      General: He is not in acute distress.    Appearance: He is well-developed.  HENT:     Head: Normocephalic and atraumatic.  Eyes:     Conjunctiva/sclera: Conjunctivae normal.  Cardiovascular:     Rate and Rhythm: Normal rate and regular rhythm.     Heart sounds: No murmur heard. Pulmonary:     Effort: Pulmonary effort is normal. No respiratory distress.     Breath sounds: Normal breath sounds.  Abdominal:     Palpations: Abdomen is soft. There is no hepatomegaly or mass.     Tenderness: There is no abdominal tenderness.  Genitourinary:    Comments: Declined rectal exam. Lymphadenopathy:      Cervical: No cervical adenopathy.  Skin:    General: Skin is warm.     Findings: No erythema or rash.  Neurological:     Mental Status: He is alert and oriented to person, place, and time.     Cranial Nerves: No cranial nerve deficit.     Gait: Gait normal.  Psychiatric:     Comments: Well groomed, good eye contact.   ASSESSMENT AND PLAN:  Mr.Jameis was seen today for abdominal pain.  Diagnoses and all orders for this visit: Orders Placed This Encounter  Procedures   TSH   C-reactive protein   Sedimentation rate   Lab Results  Component Value Date   CRP <1.0 04/12/2021   Lab Results  Component Value Date   ESRSEDRATE 2 04/12/2021   Lab Results  Component  Value Date   TSH 1.35 04/12/2021   Gastroesophageal reflux disease, unspecified whether esophagitis present Symptoms improved some with PPI, recommend increasing dose of Omeprazole from 20 mg to 40 mg daily. GERD precautions. Continue avoiding alcohol intake.  -     omeprazole (PRILOSEC) 40 MG capsule; TAKE 1 CAPSULE(40 MG) BY MOUTH DAILY  Abnormal TSH Mild. Further recommendations according to TSH result.  Rectal bleed We discussed possible etiologies. ? Internal hemorrhoids.  RUQ abdominal pain Improved. Examination today negative. Instructed about warning signs. I do not think imaging is needed at this time.  Anxiety disorder, unspecified type He is not interested in pharmacologic treatment. Recommend considering CBT.  Return in about 3 months (around 07/12/2021) for GERD and RUQ abd pain.Marland Kitchen  Shawneen Deetz G. [place], MD  [facility]. [place] office.

## 2021-04-12 NOTE — Patient Instructions (Addendum)
A few things to remember from today's visit:  Anxiety disorder, unspecified type  Gastroesophageal reflux disease, unspecified whether esophagitis present - Plan: omeprazole (PRILOSEC) 40 MG capsule  Abnormal TSH - Plan: TSH  If you need refills please call your pharmacy. Do not use My Chart to request refills or for acute issues that need immediate attention.   Omeprazole increased from 20 mg to 40 mg. Consider counseling to help with anxiety. Gastroesophageal Reflux Disease, Adult Gastroesophageal reflux (GER) happens when acid from the stomach flows up into the tube that connects the mouth and the stomach (esophagus). Normally, food travels down the esophagus and stays in the stomach to be digested. With GER, food and stomach acid sometimes move back up into the esophagus. You may have a disease called gastroesophageal reflux disease (GERD) if the reflux: Happens often. Causes frequent or very bad symptoms. Causes problems such as damage to the esophagus. When this happens, the esophagus becomes sore and swollen. Over time, GERD can make small holes (ulcers) in the lining of the esophagus. What are the causes? This condition is caused by a problem with the muscle between the esophagus and the stomach. When this muscle is weak or not normal, it does not close properly to keep food and acid from coming back up from the stomach. The muscle can be weak because of: Tobacco use. Pregnancy. Having a certain type of hernia (hiatal hernia). Alcohol use. Certain foods and drinks, such as coffee, chocolate, onions, and peppermint. What increases the risk? Being overweight. Having a disease that affects your connective tissue. Taking NSAIDs, such a ibuprofen. What are the signs or symptoms? Heartburn. Difficult or painful swallowing. The feeling of having a lump in the throat. A bitter taste in the mouth. Bad breath. Having a lot of saliva. Having an upset or bloated  stomach. Burping. Chest pain. Different conditions can cause chest pain. Make sure you see your doctor if you have chest pain. Shortness of breath or wheezing. A long-term cough or a cough at night. Wearing away of the surface of teeth (tooth enamel). Weight loss. How is this treated? Making changes to your diet. Taking medicine. Having surgery. Treatment will depend on how bad your symptoms are. Follow these instructions at home: Eating and drinking  Follow a diet as told by your doctor. You may need to avoid foods and drinks such as: Coffee and tea, with or without caffeine. Drinks that contain alcohol. Energy drinks and sports drinks. Bubbly (carbonated) drinks or sodas. Chocolate and cocoa. Peppermint and mint flavorings. Garlic and onions. Horseradish. Spicy and acidic foods. These include peppers, chili powder, curry powder, vinegar, hot sauces, and BBQ sauce. Citrus fruit juices and citrus fruits, such as oranges, lemons, and limes. Tomato-based foods. These include red sauce, chili, salsa, and pizza with red sauce. Fried and fatty foods. These include donuts, french fries, potato chips, and high-fat dressings. High-fat meats. These include hot dogs, rib eye steak, sausage, ham, and bacon. High-fat dairy items, such as whole milk, butter, and cream cheese. Eat small meals often. Avoid eating large meals. Avoid drinking large amounts of liquid with your meals. Avoid eating meals during the 2-3 hours before bedtime. Avoid lying down right after you eat. Do not exercise right after you eat. Lifestyle  Do not smoke or use any products that contain nicotine or tobacco. If you need help quitting, ask your doctor. Try to lower your stress. If you need help doing this, ask your doctor. If you are overweight,  lose an amount of weight that is healthy for you. Ask your doctor about a safe weight loss goal. General instructions Pay attention to any changes in your symptoms. Take  over-the-counter and prescription medicines only as told by your doctor. Do not take aspirin, ibuprofen, or other NSAIDs unless your doctor says it is okay. Wear loose clothes. Do not wear anything tight around your waist. Raise (elevate) the head of your bed about 6 inches (15 cm). You may need to use a wedge to do this. Avoid bending over if this makes your symptoms worse. Keep all follow-up visits. Contact a doctor if: You have new symptoms. You lose weight and you do not know why. You have trouble swallowing or it hurts to swallow. You have wheezing or a cough that keeps happening. You have a hoarse voice. Your symptoms do not get better with treatment. Get help right away if: You have sudden pain in your arms, neck, jaw, teeth, or back. You suddenly feel sweaty, dizzy, or light-headed. You have chest pain or shortness of breath. You vomit and the vomit is green, yellow, or black, or it looks like blood or coffee grounds. You faint. Your poop (stool) is red, bloody, or black. You cannot swallow, drink, or eat. These symptoms may represent a serious problem that is an emergency. Do not wait to see if the symptoms will go away. Get medical help right away. Call your local emergency services (911 in the [place] Do not drive yourself to the hospital. Summary If a person has gastroesophageal reflux disease (GERD), food and stomach acid move back up into the esophagus and cause symptoms or problems such as damage to the esophagus. Treatment will depend on how bad your symptoms are. Follow a diet as told by your doctor. Take all medicines only as told by your doctor. This information is not intended to replace advice given to you by your health care provider. Make sure you discuss any questions you have with your health care provider. Document Revised: 02/01/2020 Document Reviewed: 02/01/2020 Elsevier Patient Education  2022 Elsevier Inc.  Please be sure medication list is accurate. If a  new problem present, please set up appointment sooner than planned today.

## 2021-04-13 ENCOUNTER — Encounter: Payer: Self-pay | Admitting: Family Medicine

## 2021-07-12 ENCOUNTER — Ambulatory Visit: Payer: Managed Care, Other (non HMO) | Admitting: Family Medicine

## 2021-08-08 NOTE — Progress Notes (Signed)
HPI: Eugene Sanders is a 31 y.o. male, who is here today to follow on recent visit. He was last seen on 04/12/21, when he was c/o acid reflux and abdominal pain. RUQ pain has resolved.  GERD: Omeprazole 40 mg was recommended, he took med for a few days and now prn. Heartburn has resolved. He is avoiding foods that can exacerbate symptoms. Negative for fever,abnormal wt loss,or night sweats.  Today he is c/o constipation, hard and small stools. Daily bowel movements. Denies nausea, vomiting, blood in stool or melena. Alleviated by eating more salads and fruits. He has not tried OTC medications.  Hx of depression and anxiety, denies having symptoms.   Depression screen [facility] 2/9 08/09/2021 09/22/2019  Decreased Interest 0 2  Down, Depressed, Hopeless 0 2  PHQ - 2 Score 0 4  Altered sleeping - 3  Tired, decreased energy - 3  Change in appetite - 0  Feeling bad or failure about yourself  - 0  Trouble concentrating - 2  Moving slowly or fidgety/restless - 1  Suicidal thoughts - 0  PHQ-9 Score - 13  Difficult doing work/chores - Somewhat difficult   Review of Systems  Constitutional:  Negative for activity change, appetite change and fatigue.  Respiratory:  Negative for cough, shortness of breath and wheezing.   Cardiovascular:  Negative for chest pain, palpitations and leg swelling.  Genitourinary:  Negative for decreased urine volume and hematuria.  Neurological:  Negative for syncope and weakness.  Psychiatric/Behavioral:  Negative for confusion and hallucinations. The patient is not nervous/anxious.   Rest see pertinent positives and negatives per HPI.  Current Outpatient Medications on File Prior to Visit  Medication Sig Dispense Refill   Melatonin 1 MG CHEW Chew 1 tablet by mouth at bedtime as needed (sleep).     No current facility-administered medications on file prior to visit.    Past Medical History:  Diagnosis Date   Acid reflux    Allergy    Anxiety     Depression    Hypertension    Myoclonus    No Known Allergies  Social History   Socioeconomic History   Marital status: Single    Spouse name: Not on file   Number of children: 0   Years of education: college   Highest education level: Associate degree: academic program  Occupational History   Occupation: Designer, jewellery  Tobacco Use   Smoking status: Never   Smokeless tobacco: Never  Vaping Use   Vaping Use: Never used  Substance and Sexual Activity   Alcohol use: Yes    Comment: rare   Drug use: Yes    Types: Marijuana    Comment: rare   Sexual activity: Not on file  Other Topics Concern   Not on file  Social History Narrative   Lives at [place] with his mother.   Right-handed.   No daily caffeine use.   Social Determinants of Health   Financial Resource Strain: Not on file  Food Insecurity: Not on file  Transportation Needs: Not on file  Physical Activity: Not on file  Stress: Not on file  Social Connections: Not on file   Vitals:   08/09/21 1036  BP: 128/80  Pulse: (!) 104  Resp: 16  SpO2: 99%   Body mass index is 26.04 kg/m.  Physical Exam Vitals and nursing note reviewed.  Constitutional:      General: He is not in acute distress.    Appearance: He is well-developed.  HENT:  Head: Normocephalic and atraumatic.  Eyes:     Conjunctiva/sclera: Conjunctivae normal.  Cardiovascular:     Rate and Rhythm: Normal rate and regular rhythm.     Heart sounds: No murmur heard.    Comments: HR 96/min Pulmonary:     Effort: Pulmonary effort is normal. No respiratory distress.     Breath sounds: Normal breath sounds.  Abdominal:     Palpations: Abdomen is soft. There is no hepatomegaly or mass.     Tenderness: There is no abdominal tenderness.  Skin:    General: Skin is warm.     Findings: No erythema or rash.  Neurological:     Mental Status: He is alert and oriented to person, place, and time.     Cranial Nerves: No cranial nerve deficit.      Gait: Gait normal.  Psychiatric:     Comments: Well groomed, good eye contact.   ASSESSMENT AND PLAN:  Eugene Sanders was seen today for follow-up.  Diagnoses and all orders for this visit:  Constipation, unspecified constipation type Consistently with adequate fiber and fluid intake encouraged. Benefiber 1 tsp bid OTC Miralax daily prn. F/U as needed.  Gastroesophageal reflux disease, unspecified whether esophagitis present Improved. Continue GERD precautions. Omeprazole dose decreased from 40 mg to 20 mg to continue daily prn.  -     omeprazole (PRILOSEC) 20 MG capsule; Take 1 capsule (20 mg total) by mouth daily as needed.  RUQ abdominal pain It has resolved. Instructed about warning signs.  Major depressive disorder with single episode, in full remission ([place]) Denies symptoms. F/U as needed.  Return in about 1 year (around 08/09/2022) for cpe.  Iszabella Hebenstreit G. Martinique, MD  [facility]. [place] office.

## 2021-08-09 ENCOUNTER — Ambulatory Visit (INDEPENDENT_AMBULATORY_CARE_PROVIDER_SITE_OTHER): Payer: Managed Care, Other (non HMO) | Admitting: Family Medicine

## 2021-08-09 ENCOUNTER — Encounter: Payer: Self-pay | Admitting: Family Medicine

## 2021-08-09 VITALS — BP 128/80 | HR 104 | Resp 16 | Ht 72.0 in | Wt 192.0 lb

## 2021-08-09 DIAGNOSIS — R1011 Right upper quadrant pain: Secondary | ICD-10-CM

## 2021-08-09 DIAGNOSIS — K5900 Constipation, unspecified: Secondary | ICD-10-CM | POA: Diagnosis not present

## 2021-08-09 DIAGNOSIS — F325 Major depressive disorder, single episode, in full remission: Secondary | ICD-10-CM

## 2021-08-09 DIAGNOSIS — K219 Gastro-esophageal reflux disease without esophagitis: Secondary | ICD-10-CM

## 2021-08-09 MED ORDER — OMEPRAZOLE 20 MG PO CPDR: 20.0000 mg | DELAYED_RELEASE_CAPSULE | Freq: Every day | ORAL | 1 refills | Status: DC | PRN

## 2021-08-09 NOTE — Patient Instructions (Addendum)
A few things to remember from today's visit:  Constipation, unspecified constipation type  Gastroesophageal reflux disease, unspecified whether esophagitis present - Plan: omeprazole (PRILOSEC) 20 MG capsule Benefiber 1 tsp 2 times daily. Omeprazole decreased to 20 mg to take as needed.  If you need refills please call your pharmacy. Do not use My Chart to request refills or for acute issues that need immediate attention.  Constipation, Adult Constipation is when a person has fewer than three bowel movements in a week, has difficulty having a bowel movement, or has stools (feces) that are dry, hard, or larger than normal. Constipation may be caused by an underlying condition. It may become worse with age if a person takes certain medicines and does not take in enough fluids. Follow these instructions at home: Eating and drinking  Eat foods that have a lot of fiber, such as beans, whole grains, and fresh fruits and vegetables. Limit foods that are low in fiber and high in fat and processed sugars, such as fried or sweet foods. These include french fries, hamburgers, cookies, candies, and soda. Drink enough fluid to keep your urine pale yellow. General instructions Exercise regularly or as told by your health care provider. Try to do 150 minutes of moderate exercise each week. Use the bathroom when you have the urge to go. Do not hold it in. Take over-the-counter and prescription medicines only as told by your health care provider. This includes any fiber supplements. During bowel movements: Practice deep breathing while relaxing the lower abdomen. Practice pelvic floor relaxation. Watch your condition for any changes. Let your health care provider know about them. Keep all follow-up visits as told by your health care provider. This is important. Contact a health care provider if: You have pain that gets worse. You have a fever. You do not have a bowel movement after 4 days. You  vomit. You are not hungry or you lose weight. You are bleeding from the opening between the buttocks (anus). You have thin, pencil-like stools. Get help right away if: You have a fever and your symptoms suddenly get worse. You leak stool or have blood in your stool. Your abdomen is bloated. You have severe pain in your abdomen. You feel dizzy or you faint. Summary Constipation is when a person has fewer than three bowel movements in a week, has difficulty having a bowel movement, or has stools (feces) that are dry, hard, or larger than normal. Eat foods that have a lot of fiber, such as beans, whole grains, and fresh fruits and vegetables. Drink enough fluid to keep your urine pale yellow. Take over-the-counter and prescription medicines only as told by your health care provider. This includes any fiber supplements. This information is not intended to replace advice given to you by your health care provider. Make sure you discuss any questions you have with your health care provider. Document Revised: 06/10/2019 Document Reviewed: 06/10/2019 Elsevier Patient Education  2022 Elsevier Inc.  Please be sure medication list is accurate. If a new problem present, please set up appointment sooner than planned today.

## 2021-08-12 ENCOUNTER — Encounter: Payer: Self-pay | Admitting: Family Medicine

## 2022-08-10 ENCOUNTER — Encounter: Payer: Managed Care, Other (non HMO) | Admitting: Family Medicine

## 2022-09-10 NOTE — Progress Notes (Unsigned)
HPI: [name] is a 32 y.o.male with PMHx significant for HTN and GERD here today for his routine physical examination.  It has been five years since his last physical.  He does not exercise regularly but he is active at work and having a mixed diet of home-cooked meals and eating out. He consumes vegetables daily. He sleeps approximately eight hours per night.  He is a non-smoker and quit drinking alcohol one month ago, previously consuming two to three beers per night. Immunization History  Administered Date(s) Administered   Tdap 09/22/2019   Health Maintenance  Topic Date Due   COVID-19 Vaccine (1) Never done   INFLUENZA VACCINE  11/04/2022 (Originally 03/06/2022)   HIV Screening  09/22/2023 (Originally 08/12/2005)   DTaP/Tdap/Td (2 - Td or Tdap) 09/21/2029   Hepatitis C Screening  Completed   HPV VACCINES  Aged Out   HTN on non pharmacologic treatment. He attributes elevated BP's in the past  to nervousness during doctor's visits. He does not monitor his blood pressure at home.   GERD: He is currently taking OTC Omeprazole 20 mg, Prilosec.  Review of Systems  Constitutional:  Negative for activity change, appetite change and fever.  HENT:  Negative for nosebleeds, sore throat and trouble swallowing.   Eyes:  Negative for redness and visual disturbance.  Respiratory:  Negative for cough, shortness of breath and wheezing.   Cardiovascular:  Negative for chest pain, palpitations and leg swelling.  Gastrointestinal:  Negative for abdominal pain, blood in stool, nausea and vomiting.  Endocrine: Negative for cold intolerance, heat intolerance, polydipsia, polyphagia and polyuria.  Genitourinary:  Negative for decreased urine volume, dysuria, genital sores, hematuria and testicular pain.  Musculoskeletal:  Negative for gait problem and myalgias.  Skin:  Negative for color change and rash.  Allergic/Immunologic: Negative for environmental allergies.  Neurological:   Negative for syncope, weakness and headaches.  Hematological:  Negative for adenopathy. Does not bruise/bleed easily.  Psychiatric/Behavioral:  Negative for confusion and sleep disturbance. The patient is not nervous/anxious.    Current Outpatient Medications on File Prior to Visit  Medication Sig Dispense Refill   Melatonin 1 MG CHEW Chew 1 tablet by mouth at bedtime as needed (sleep).     omeprazole (PRILOSEC) 20 MG capsule Take 1 capsule (20 mg total) by mouth daily as needed. 90 capsule 1   No current facility-administered medications on file prior to visit.   Past Medical History:  Diagnosis Date   Acid reflux    Allergy    Anxiety    Depression    Hypertension    Myoclonus    Past Surgical History:  Procedure Laterality Date   no past surgery     No Known Allergies  Family History  Problem Relation Age of Onset   Hypertension Mother    Diabetes Father    Social History   Socioeconomic History   Marital status: Single    Spouse name: Not on file   Number of children: 0   Years of education: college   Highest education level: Associate degree: academic program  Occupational History   Occupation: Designer, jewellery  Tobacco Use   Smoking status: Never   Smokeless tobacco: Never  Vaping Use   Vaping Use: Never used  Substance and Sexual Activity   Alcohol use: Yes    Comment: rare   Drug use: Yes    Types: Marijuana    Comment: rare   Sexual activity: Not on file  Other Topics  Concern   Not on file  Social History Narrative   Lives at [place] with his mother.   Right-handed.   No daily caffeine use.   Social Determinants of Health   Financial Resource Strain: Not on file  Food Insecurity: Not on file  Transportation Needs: Not on file  Physical Activity: Not on file  Stress: Not on file  Social Connections: Not on file   Vitals:   09/11/22 0957  BP: 134/80  Pulse: 100  Resp: 12  Temp: 98.4 F (36.9 C)  SpO2: 99%  Body mass index is 29.18  kg/m. Wt Readings from Last 3 Encounters:  09/11/22 215 lb 2 oz (97.6 kg)  08/09/21 192 lb (87.1 kg)  04/12/21 182 lb 6 oz (82.7 kg)  Physical Exam Vitals and nursing note reviewed.  Constitutional:      General: He is not in acute distress.    Appearance: He is well-developed.  HENT:     Head: Normocephalic and atraumatic.     Right Ear: Tympanic membrane, ear canal and external ear normal.     Left Ear: Tympanic membrane, ear canal and external ear normal.     Mouth/Throat:     Mouth: Mucous membranes are moist.     Pharynx: Oropharynx is clear.  Eyes:     Extraocular Movements: Extraocular movements intact.     Conjunctiva/sclera: Conjunctivae normal.     Pupils: Pupils are equal, round, and reactive to light.  Neck:     Thyroid: No thyromegaly.     Trachea: No tracheal deviation.  Cardiovascular:     Rate and Rhythm: Normal rate and regular rhythm.     Pulses:          Dorsalis pedis pulses are 2+ on the right side and 2+ on the left side.     Heart sounds: No murmur heard. Pulmonary:     Effort: Pulmonary effort is normal. No respiratory distress.     Breath sounds: Normal breath sounds.  Abdominal:     Palpations: Abdomen is soft. There is no hepatomegaly or mass.     Tenderness: There is no abdominal tenderness.  Genitourinary:    Comments: No concerns. Musculoskeletal:        General: No tenderness.     Cervical back: Normal range of motion.     Comments: No major deformities appreciated and no signs of synovitis.  Lymphadenopathy:     Cervical: No cervical adenopathy.     Upper Body:     Right upper body: No supraclavicular adenopathy.     Left upper body: No supraclavicular adenopathy.  Skin:    General: Skin is warm.     Findings: No erythema.  Neurological:     General: No focal deficit present.     Mental Status: He is alert and oriented to person, place, and time.     Cranial Nerves: No cranial nerve deficit.     Sensory: No sensory deficit.      Gait: Gait normal.     Deep Tendon Reflexes:     Reflex Scores:      Bicep reflexes are 2+ on the right side and 2+ on the left side.      Patellar reflexes are 2+ on the right side and 2+ on the left side. Psychiatric:        Mood and Affect: Mood and affect normal.   ASSESSMENT AND PLAN:  [name] was seen today for annual exam.  Diagnoses and all orders  for this visit: Lab Results  Component Value Date   CHOL 167 09/11/2022   HDL 41.30 09/11/2022   LDLCALC 106 (H) 09/11/2022   TRIG 99.0 09/11/2022   CHOLHDL 4 09/11/2022   Lab Results  Component Value Date   CREATININE 1.03 09/11/2022   BUN 13 09/11/2022   NA 141 09/11/2022   K 4.0 09/11/2022   CL 103 09/11/2022   CO2 27 09/11/2022   Lab Results  Component Value Date   TSH 3.57 09/11/2022  Routine general medical examination at a health care facility Assessment & Plan: We discussed the importance of regular physical activity and healthy diet for prevention of chronic illness and/or complications. Preventive guidelines reviewed. Vaccination up to date. Next CPE in a year.   Encounter for HCV screening test for low risk patient -     Hepatitis C antibody; Future  Hypertension, essential, benign Assessment & Plan: Recommend monitoring BP at home regularly. I would like BP to be < 130/80.  Orders: -     Basic metabolic panel; Future -     TSH; Future  Screening for lipoid disorders -     Lipid panel; Future   Return in 1 year (on 09/12/2023) for CPE.  Zanobia Griebel G. [place], MD  [facility]. [place] office.

## 2022-09-11 ENCOUNTER — Encounter: Payer: Self-pay | Admitting: Family Medicine

## 2022-09-11 ENCOUNTER — Ambulatory Visit (INDEPENDENT_AMBULATORY_CARE_PROVIDER_SITE_OTHER): Payer: 59 | Admitting: Family Medicine

## 2022-09-11 VITALS — BP 134/80 | HR 100 | Temp 98.4°F | Resp 12 | Ht 72.0 in | Wt 215.1 lb

## 2022-09-11 DIAGNOSIS — I10 Essential (primary) hypertension: Secondary | ICD-10-CM | POA: Diagnosis not present

## 2022-09-11 DIAGNOSIS — Z13220 Encounter for screening for lipoid disorders: Secondary | ICD-10-CM | POA: Diagnosis not present

## 2022-09-11 DIAGNOSIS — Z0000 Encounter for general adult medical examination without abnormal findings: Secondary | ICD-10-CM

## 2022-09-11 DIAGNOSIS — Z1159 Encounter for screening for other viral diseases: Secondary | ICD-10-CM

## 2022-09-11 LAB — BASIC METABOLIC PANEL
BUN: 13 mg/dL (ref 6–23)
CO2: 27 mEq/L (ref 19–32)
Calcium: 9.4 mg/dL (ref 8.4–10.5)
Chloride: 103 mEq/L (ref 96–112)
Creatinine, Ser: 1.03 mg/dL (ref 0.40–1.50)
GFR: 96.41 mL/min (ref >60.00)
Glucose, Bld: 92 mg/dL (ref 70–99)
Potassium: 4 mEq/L (ref 3.5–5.1)
Sodium: 141 mEq/L (ref 135–145)

## 2022-09-11 LAB — TSH: TSH: 3.57 u[IU]/mL (ref 0.35–5.50)

## 2022-09-11 LAB — LIPID PANEL
Cholesterol: 167 mg/dL (ref 0–200)
HDL: 41.3 mg/dL (ref >39.00)
LDL Cholesterol: 106 mg/dL — ABNORMAL HIGH (ref 0–99)
NonHDL: 126
Total CHOL/HDL Ratio: 4
Triglycerides: 99 mg/dL (ref 0.0–149.0)
VLDL: 19.8 mg/dL (ref 0.0–40.0)

## 2022-09-11 NOTE — Assessment & Plan Note (Signed)
Recommend monitoring BP at home regularly. I would like BP to be < 130/80.

## 2022-09-11 NOTE — Patient Instructions (Addendum)
A few things to remember from today's visit:  Encounter for HCV screening test for low risk patient - Plan: Hepatitis C antibody  Hypertension, essential, benign - Plan: Basic metabolic panel, TSH  Routine general medical examination at a health care facility  Screening for lipoid disorders - Plan: Lipid panel  If you need refills for medications you take chronically, please call your pharmacy. Do not use My Chart to request refills or for acute issues that need immediate attention. If you send a my chart message, it may take a few days to be addressed, specially if I am not in the office.  Please be sure medication list is accurate. If a new problem present, please set up appointment sooner than planned today.  Health Maintenance, Male Adopting a healthy lifestyle and getting preventive care are important in promoting health and wellness. Ask your health care provider about: The right schedule for you to have regular tests and exams. Things you can do on your own to prevent diseases and keep yourself healthy. What should I know about diet, weight, and exercise? Eat a healthy diet  Eat a diet that includes plenty of vegetables, fruits, low-fat dairy products, and lean protein. Do not eat a lot of foods that are high in solid fats, added sugars, or sodium. Maintain a healthy weight Body mass index (BMI) is a measurement that can be used to identify possible weight problems. It estimates body fat based on height and weight. Your health care provider can help determine your BMI and help you achieve or maintain a healthy weight. Get regular exercise Get regular exercise. This is one of the most important things you can do for your health. Most adults should: Exercise for at least 150 minutes each week. The exercise should increase your heart rate and make you sweat (moderate-intensity exercise). Do strengthening exercises at least twice a week. This is in addition to the moderate-intensity  exercise. Spend less time sitting. Even light physical activity can be beneficial. Watch cholesterol and blood lipids Have your blood tested for lipids and cholesterol at 32 years of age, then have this test every 5 years. You may need to have your cholesterol levels checked more often if: Your lipid or cholesterol levels are high. You are older than 32 years of age. You are at high risk for heart disease. What should I know about cancer screening? Many types of cancers can be detected early and may often be prevented. Depending on your health history and family history, you may need to have cancer screening at various ages. This may include screening for: Colorectal cancer. Prostate cancer. Skin cancer. Lung cancer. What should I know about heart disease, diabetes, and high blood pressure? Blood pressure and heart disease High blood pressure causes heart disease and increases the risk of stroke. This is more likely to develop in people who have high blood pressure readings or are overweight. Talk with your health care provider about your target blood pressure readings. Have your blood pressure checked: Every 3-5 years if you are 41-57 years of age. Every year if you are 71 years old or older. If you are between the ages of 81 and 12 and are a current or former smoker, ask your health care provider if you should have a one-time screening for abdominal aortic aneurysm (AAA). Diabetes Have regular diabetes screenings. This checks your fasting blood sugar level. Have the screening done: Once every three years after age 88 if you are at a normal weight  and have a low risk for diabetes. More often and at a younger age if you are overweight or have a high risk for diabetes. What should I know about preventing infection? Hepatitis B If you have a higher risk for hepatitis B, you should be screened for this virus. Talk with your health care provider to find out if you are at risk for hepatitis B  infection. Hepatitis C Blood testing is recommended for: Everyone born from [birth year] through 1965. Anyone with known risk factors for hepatitis C. Sexually transmitted infections (STIs) You should be screened each year for STIs, including gonorrhea and chlamydia, if: You are sexually active and are younger than 32 years of age. You are older than 32 years of age and your health care provider tells you that you are at risk for this type of infection. Your sexual activity has changed since you were last screened, and you are at increased risk for chlamydia or gonorrhea. Ask your health care provider if you are at risk. Ask your health care provider about whether you are at high risk for HIV. Your health care provider may recommend a prescription medicine to help prevent HIV infection. If you choose to take medicine to prevent HIV, you should first get tested for HIV. You should then be tested every 3 months for as long as you are taking the medicine. Follow these instructions at home: Alcohol use Do not drink alcohol if your health care provider tells you not to drink. If you drink alcohol: Limit how much you have to 0-2 drinks a day. Know how much alcohol is in your drink. In the [place] one drink equals one 12 oz bottle of beer (355 mL), one 5 oz glass of wine (148 mL), or one 1 oz glass of hard liquor (44 mL). Lifestyle Do not use any products that contain nicotine or tobacco. These products include cigarettes, chewing tobacco, and vaping devices, such as e-cigarettes. If you need help quitting, ask your health care provider. Do not use street drugs. Do not share needles. Ask your health care provider for help if you need support or information about quitting drugs. General instructions Schedule regular health, dental, and eye exams. Stay current with your vaccines. Tell your health care provider if: You often feel depressed. You have ever been abused or do not feel safe at  home. Summary Adopting a healthy lifestyle and getting preventive care are important in promoting health and wellness. Follow your health care provider's instructions about healthy diet, exercising, and getting tested or screened for diseases. Follow your health care provider's instructions on monitoring your cholesterol and blood pressure. This information is not intended to replace advice given to you by your health care provider. Make sure you discuss any questions you have with your health care provider. Document Revised: 12/12/2020 Document Reviewed: 12/12/2020 [employer] Patient Education  [place].

## 2022-09-12 LAB — HEPATITIS C ANTIBODY: Hepatitis C Ab: NONREACTIVE

## 2022-09-13 NOTE — Assessment & Plan Note (Signed)
We discussed the importance of regular physical activity and healthy diet for prevention of chronic illness and/or complications. Preventive guidelines reviewed. Vaccination up to date. Next CPE in a year. 

## 2022-09-17 ENCOUNTER — Other Ambulatory Visit: Payer: Self-pay | Admitting: Family Medicine

## 2022-09-17 DIAGNOSIS — K219 Gastro-esophageal reflux disease without esophagitis: Secondary | ICD-10-CM

## 2024-05-11 ENCOUNTER — Encounter: Admitting: Family Medicine

## 2024-06-08 ENCOUNTER — Encounter: Admitting: Family Medicine
